# Patient Record
Sex: Male | Born: 1960
Health system: Southern US, Community
[De-identification: ages and names within clinical notes are randomized; demographics above are authoritative.]

## PROBLEM LIST (undated history)

## (undated) DIAGNOSIS — K635 Polyp of colon: Secondary | ICD-10-CM

## (undated) DIAGNOSIS — K5792 Diverticulitis of intestine, part unspecified, without perforation or abscess without bleeding: Secondary | ICD-10-CM

## (undated) DIAGNOSIS — Z86010 Personal history of colonic polyps: Secondary | ICD-10-CM

## (undated) DIAGNOSIS — K219 Gastro-esophageal reflux disease without esophagitis: Secondary | ICD-10-CM

## (undated) DIAGNOSIS — K760 Fatty (change of) liver, not elsewhere classified: Secondary | ICD-10-CM

## (undated) DIAGNOSIS — I10 Essential (primary) hypertension: Secondary | ICD-10-CM

## (undated) HISTORY — DX: Fatty (change of) liver, not elsewhere classified: K76.0

## (undated) HISTORY — DX: Gastro-esophageal reflux disease without esophagitis: K21.9

## (undated) HISTORY — DX: Polyp of colon: K63.5

## (undated) HISTORY — DX: Diverticulitis of intestine, part unspecified, without perforation or abscess without bleeding: K57.92

## (undated) HISTORY — DX: Personal history of colonic polyps: Z86.010

---
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
## 2004-02-14 ENCOUNTER — Emergency Department (HOSPITAL_COMMUNITY): Admission: EM | Admit: 2004-02-14 | Discharge: 2004-02-14 | Payer: Self-pay | Admitting: Emergency Medicine

## 2013-01-19 ENCOUNTER — Encounter (HOSPITAL_COMMUNITY): Payer: Self-pay | Admitting: *Deleted

## 2013-01-19 ENCOUNTER — Emergency Department (HOSPITAL_COMMUNITY)
Admission: EM | Admit: 2013-01-19 | Discharge: 2013-01-19 | Disposition: A | Discharge status: Home / Self Care | Payer: BC Managed Care – PPO | Attending: Emergency Medicine | Admitting: Emergency Medicine

## 2013-01-19 DIAGNOSIS — T1590XA Foreign body on external eye, part unspecified, unspecified eye, initial encounter: Secondary | ICD-10-CM | POA: Insufficient documentation

## 2013-01-19 DIAGNOSIS — Y929 Unspecified place or not applicable: Secondary | ICD-10-CM | POA: Insufficient documentation

## 2013-01-19 DIAGNOSIS — Y9389 Activity, other specified: Secondary | ICD-10-CM | POA: Insufficient documentation

## 2013-01-19 DIAGNOSIS — T1592XA Foreign body on external eye, part unspecified, left eye, initial encounter: Secondary | ICD-10-CM

## 2013-01-19 DIAGNOSIS — Z79899 Other long term (current) drug therapy: Secondary | ICD-10-CM | POA: Insufficient documentation

## 2013-01-19 DIAGNOSIS — I10 Essential (primary) hypertension: Secondary | ICD-10-CM | POA: Insufficient documentation

## 2013-01-19 DIAGNOSIS — Z7982 Long term (current) use of aspirin: Secondary | ICD-10-CM | POA: Insufficient documentation

## 2013-01-19 HISTORY — DX: Essential (primary) hypertension: I10

## 2013-01-19 MED ORDER — TETRACAINE HCL 0.5 % OP SOLN
1.0000 [drp] | Freq: Once | OPHTHALMIC | Status: AC
  Administered 2013-01-19: 1 [drp] via OPHTHALMIC
  Filled 2013-01-19: qty 2

## 2013-01-19 MED ORDER — ERYTHROMYCIN 5 MG/GM OP OINT: TOPICAL_OINTMENT | OPHTHALMIC | Status: DC

## 2013-01-19 MED ORDER — FLUORESCEIN SODIUM 1 MG OP STRP
1.0000 | ORAL_STRIP | Freq: Once | OPHTHALMIC | Status: AC
  Administered 2013-01-19: 1 via OPHTHALMIC
  Filled 2013-01-19: qty 1

## 2013-01-19 NOTE — ED Notes (Signed)
Pt states may have scratched his right eye yesterday; or have something in it; this morning woke up with swelling/draining; painful

## 2013-01-19 NOTE — Progress Notes (Signed)
Pt confirms pcp is [facility] updated

## 2013-01-19 NOTE — ED Notes (Signed)
Pt alert and oriented x4. Respirations even and unlabored. Bilateral rise and fall of chest. Skin warm and dry. In no acute distress. Denies needs.  

## 2013-01-19 NOTE — ED Notes (Signed)
Pt escorted to discharge window. Verbalized understanding discharge instructions. In no acute distress.   

## 2013-01-19 NOTE — ED Provider Notes (Signed)
History     CSN: [id number] Arrival date & time 01/19/13  4098 First MD Initiated Contact with Patient 01/19/13 [phone]      Chief Complaint  Patient presents with  . Eye Pain    HPI Pt was chiseling thin set yesterday when he felt like something got in his eye around 1pm, left eye.  He tried flushing it out but the symptoms have persisted.  He feels like there is debris in the medial canthus aspect. He denies any blurred vision. He does not wear any contacts. He has not had any fevers or change in his vision.   Past Medical History  Diagnosis Date  . Hypertension     History reviewed. No pertinent past surgical history.  No family history on file.  History  Substance Use Topics  . Smoking status: Never Smoker   . Smokeless tobacco: Not on file  . Alcohol Use: No      Review of Systems  All other systems reviewed and are negative.    Allergies  Review of patient's allergies indicates no known allergies.  Home Medications   Current Outpatient Rx  Name  Route  Sig  Dispense  Refill  . aspirin EC 81 MG tablet   Oral   Take 81 mg by mouth daily.         . Coenzyme Q10 (CO Q 10 PO)   Oral   Take 1 tablet by mouth daily.         . Multiple Vitamin (MULTIVITAMIN WITH MINERALS) TABS   Oral   Take 1 tablet by mouth daily.         . Omega-3 Fatty Acids (FISH OIL BURP-LESS PO)   Oral   Take 1 capsule by mouth 2 (two) times daily.         . RABEprazole (ACIPHEX) 20 MG tablet   Oral   Take 20 mg by mouth daily.         Marland Kitchen erythromycin ophthalmic ointment      Place a 1/2 inch ribbon of ointment into the lower eyelid.   1 g   0     BP 153/79  Pulse 67  Temp(Src) 97.6 F (36.4 C) (Oral)  Resp 18  Wt 268 lb (121.564 kg)  SpO2 99%  Physical Exam  Nursing note and vitals reviewed. Constitutional: He appears well-developed and well-nourished. No distress.  HENT:  Head: Normocephalic and atraumatic.  Right Ear: External ear normal.  Left Ear:  External ear normal.  Eyes: Conjunctivae are normal. Pupils are equal, round, and reactive to light. Right eye exhibits no chemosis, no discharge and no exudate. Left eye exhibits no chemosis, no discharge and no exudate. Right conjunctiva is not injected. Left conjunctiva is not injected. No scleral icterus.  Slit lamp exam:      The left eye shows foreign body (small amount of particulate debris inferiorly). The left eye shows no corneal abrasion, no corneal flare and no corneal ulcer.  Neck: Neck supple. No tracheal deviation present.  Cardiovascular: Normal rate.   Pulmonary/Chest: Effort normal. No stridor. No respiratory distress.  Musculoskeletal: He exhibits no edema.  Neurological: He is alert. Cranial nerve deficit: no gross deficits.  Skin: Skin is warm and dry. No rash noted.  Psychiatric: He has a normal mood and affect.    ED Course  Procedures (including critical care time)  Labs Reviewed - No data to display No results found.   1. Eye foreign body, left, initial encounter  MDM  The patient is eyelids were everted and examined. Patient did not have any large foreign body. He did appear to have some white debris within the conjunctival folds. This may have been the thin set material that he was referring.  The patient's eye was irrigated with 500 cc of normal saline. I will refer him to the ophthalmologist for follow up if the symptoms persist.  Patient prescribed antibiotic eye ointment for comfort. Discussed followup plans and reasons to return to emergency room        Celene Kras, MD 01/19/13 [phone]

## 2013-01-19 NOTE — ED Notes (Signed)
Medication and Slit lamp at bedside.  MD notified.

## 2013-05-23 DIAGNOSIS — K5792 Diverticulitis of intestine, part unspecified, without perforation or abscess without bleeding: Secondary | ICD-10-CM

## 2013-05-23 HISTORY — DX: Diverticulitis of intestine, part unspecified, without perforation or abscess without bleeding: K57.92

## 2013-06-21 ENCOUNTER — Emergency Department (HOSPITAL_COMMUNITY): Payer: BC Managed Care – PPO

## 2013-06-21 ENCOUNTER — Emergency Department (HOSPITAL_COMMUNITY)
Admission: EM | Admit: 2013-06-21 | Discharge: 2013-06-22 | Disposition: A | Discharge status: Home / Self Care | Payer: BC Managed Care – PPO | Attending: Emergency Medicine | Admitting: Emergency Medicine

## 2013-06-21 ENCOUNTER — Encounter (HOSPITAL_COMMUNITY): Payer: Self-pay | Admitting: Emergency Medicine

## 2013-06-21 DIAGNOSIS — Z7982 Long term (current) use of aspirin: Secondary | ICD-10-CM | POA: Insufficient documentation

## 2013-06-21 DIAGNOSIS — K5792 Diverticulitis of intestine, part unspecified, without perforation or abscess without bleeding: Secondary | ICD-10-CM

## 2013-06-21 DIAGNOSIS — K760 Fatty (change of) liver, not elsewhere classified: Secondary | ICD-10-CM

## 2013-06-21 DIAGNOSIS — K5732 Diverticulitis of large intestine without perforation or abscess without bleeding: Secondary | ICD-10-CM | POA: Insufficient documentation

## 2013-06-21 DIAGNOSIS — Z79899 Other long term (current) drug therapy: Secondary | ICD-10-CM | POA: Insufficient documentation

## 2013-06-21 DIAGNOSIS — I10 Essential (primary) hypertension: Secondary | ICD-10-CM | POA: Insufficient documentation

## 2013-06-21 DIAGNOSIS — R7402 Elevation of levels of lactic acid dehydrogenase (LDH): Secondary | ICD-10-CM | POA: Insufficient documentation

## 2013-06-21 DIAGNOSIS — K7689 Other specified diseases of liver: Secondary | ICD-10-CM | POA: Insufficient documentation

## 2013-06-21 DIAGNOSIS — R7401 Elevation of levels of liver transaminase levels: Secondary | ICD-10-CM | POA: Insufficient documentation

## 2013-06-21 LAB — LIPASE, BLOOD: Lipase: 24 U/L (ref 11–59)

## 2013-06-21 LAB — CBC WITH DIFFERENTIAL/PLATELET
Basophils Absolute: 0 10*3/uL (ref 0.0–0.1)
Basophils Relative: 0 % (ref 0–1)
Eosinophils Absolute: 0.1 10*3/uL (ref 0.0–0.7)
Eosinophils Relative: 1 % (ref 0–5)
HCT: 46.2 % (ref 39.0–52.0)
Hemoglobin: 16.7 g/dL (ref 13.0–17.0)
Lymphocytes Relative: 27 % (ref 12–46)
Lymphs Abs: 3.4 10*3/uL (ref 0.7–4.0)
MCH: 32.3 pg (ref 26.0–34.0)
MCHC: 36.1 g/dL — ABNORMAL HIGH (ref 30.0–36.0)
MCV: 89.4 fL (ref 78.0–100.0)
Monocytes Absolute: 1.4 10*3/uL — ABNORMAL HIGH (ref 0.1–1.0)
Monocytes Relative: 11 % (ref 3–12)
Neutro Abs: 7.8 10*3/uL — ABNORMAL HIGH (ref 1.7–7.7)
Neutrophils Relative %: 61 % (ref 43–77)
Platelets: 293 10*3/uL (ref 150–400)
RBC: 5.17 MIL/uL (ref 4.22–5.81)
RDW: 12.5 % (ref 11.5–15.5)
WBC: 12.7 10*3/uL — ABNORMAL HIGH (ref 4.0–10.5)

## 2013-06-21 LAB — URINALYSIS, ROUTINE W REFLEX MICROSCOPIC
Bilirubin Urine: NEGATIVE
Glucose, UA: NEGATIVE mg/dL
Hgb urine dipstick: NEGATIVE
Ketones, ur: NEGATIVE mg/dL
Leukocytes, UA: NEGATIVE
Nitrite: NEGATIVE
Protein, ur: NEGATIVE mg/dL
Specific Gravity, Urine: 1.028 (ref 1.005–1.030)
Urobilinogen, UA: 0.2 mg/dL (ref 0.0–1.0)
pH: 6 (ref 5.0–8.0)

## 2013-06-21 LAB — COMPREHENSIVE METABOLIC PANEL
ALT: 73 U/L — ABNORMAL HIGH (ref 0–53)
AST: 56 U/L — ABNORMAL HIGH (ref 0–37)
Albumin: 4.4 g/dL (ref 3.5–5.2)
Alkaline Phosphatase: 78 U/L (ref 39–117)
BUN: 17 mg/dL (ref 6–23)
CO2: 27 mEq/L (ref 19–32)
Calcium: 10.1 mg/dL (ref 8.4–10.5)
Chloride: 98 mEq/L (ref 96–112)
Creatinine, Ser: 1.08 mg/dL (ref 0.50–1.35)
GFR calc Af Amer: 89 mL/min — ABNORMAL LOW (ref >90)
GFR calc non Af Amer: 77 mL/min — ABNORMAL LOW (ref >90)
Glucose, Bld: 98 mg/dL (ref 70–99)
Potassium: 4.8 mEq/L (ref 3.5–5.1)
Sodium: 137 mEq/L (ref 135–145)
Total Bilirubin: 0.8 mg/dL (ref 0.3–1.2)
Total Protein: 8.1 g/dL (ref 6.0–8.3)

## 2013-06-21 MED ORDER — FENTANYL CITRATE 0.05 MG/ML IJ SOLN
100.0000 ug | Freq: Once | INTRAMUSCULAR | Status: AC
  Administered 2013-06-21: 100 ug via INTRAVENOUS

## 2013-06-21 MED ORDER — IOHEXOL 300 MG/ML  SOLN
50.0000 mL | Freq: Once | INTRAMUSCULAR | Status: AC | PRN
  Administered 2013-06-21: 50 mL via ORAL

## 2013-06-21 MED ORDER — ONDANSETRON HCL 4 MG/2ML IJ SOLN
4.0000 mg | Freq: Once | INTRAMUSCULAR | Status: AC
  Administered 2013-06-21: 4 mg via INTRAVENOUS

## 2013-06-21 MED ORDER — ONDANSETRON HCL 4 MG/2ML IJ SOLN
INTRAMUSCULAR | Status: AC
  Administered 2013-06-21: 4 mg via INTRAVENOUS
  Filled 2013-06-21: qty 2

## 2013-06-21 MED ORDER — SODIUM CHLORIDE 0.9 % IV SOLN
Freq: Once | INTRAVENOUS | Status: AC
  Administered 2013-06-21: 23:00:00 via INTRAVENOUS

## 2013-06-21 MED ORDER — FENTANYL CITRATE 0.05 MG/ML IJ SOLN
INTRAMUSCULAR | Status: AC
  Administered 2013-06-21: 100 ug via INTRAVENOUS
  Filled 2013-06-21: qty 2

## 2013-06-21 NOTE — ED Notes (Signed)
Pt states for the past week or so he has been having pain in his lower back on the left side  Pt states today when he got home from work he was rubbing his back and when he hit his flank area he states he had a lot of pain there  Pt states when he takes a deep breath it hurts in that area  Pt states he has nausea

## 2013-06-21 NOTE — ED Provider Notes (Signed)
CSN: [id number]     Arrival date & time 06/21/13  2014 History   First MD Initiated Contact with Patient 06/21/13 2159     Chief Complaint  Patient presents with  . Back Pain   (Consider location/radiation/quality/duration/timing/severity/associated sxs/prior Treatment) HPI  Andrew Reyes is a 52 y/o male who presents with cc back pain. The patient has a history of upper back pain. He states that he sent to get on his heating pad last night and was rubbing his lower back when he noticed a sharp severe pain in his flank and left lower quadrant of the abdomen.  Patient states that the pain has been constant for that time.  He states that is better when he lies completely still.  Worse when standing or walking.  He has pain when he breathes deeply or coughs.  Patient denies any nausea, vomiting, diarrhea or other changes in his stool.  Patient denies history of kidney stones, urinary symptoms.  Denies weakness, loss of bowel/bladder function or saddle anesthesia. Denies neck stiffness, headache, rash.  Denies fever or recent procedures to back.    Past Medical History  Diagnosis Date  . Hypertension    History reviewed. No pertinent past surgical history. Family History  Problem Relation Age of Onset  . Hypertension Mother   . Diabetes Mother   . Stroke Mother   . Hypertension Father   . Diabetes Father    History  Substance Use Topics  . Smoking status: Never Smoker   . Smokeless tobacco: Not on file  . Alcohol Use: Yes     Comment: weekend    Review of Systems Ten systems reviewed and are negative for acute change, except as noted in the HPI. \ Allergies  Review of patient's allergies indicates no known allergies.  Home Medications   Current Outpatient Rx  Name  Route  Sig  Dispense  Refill  . aspirin EC 81 MG tablet   Oral   Take 81 mg by mouth daily.         . Coenzyme Q10 (CO Q 10 PO)   Oral   Take 1 tablet by mouth daily.         Marland Kitchen  lisinopril-hydrochlorothiazide (PRINZIDE,ZESTORETIC) 20-12.5 MG per tablet   Oral   Take 1 tablet by mouth daily.          . Multiple Vitamin (MULTIVITAMIN WITH MINERALS) TABS   Oral   Take 1 tablet by mouth daily.         . nabumetone (RELAFEN) 500 MG tablet   Oral   Take 500 mg by mouth 2 (two) times daily as needed for pain.          . Omega-3 Fatty Acids (FISH OIL BURP-LESS PO)   Oral   Take 1 capsule by mouth 2 (two) times daily.         . RABEprazole (ACIPHEX) 20 MG tablet   Oral   Take 20 mg by mouth daily.         Marland Kitchen tiZANidine (ZANAFLEX) 4 MG tablet   Oral   Take 4 mg by mouth every 6 (six) hours as needed (muscle spasms).           BP 126/71  Pulse 70  Temp(Src) 98.5 F (36.9 C) (Oral)  Resp 22  SpO2 96% Physical Exam  Nursing note and vitals reviewed. Constitutional: He appears well-developed and well-nourished.  Patient appears very uncomfortable.  HENT:  Head: Normocephalic and atraumatic.  Eyes:  Conjunctivae are normal. No scleral icterus.  Neck: Normal range of motion. Neck supple.  Cardiovascular: Normal rate, regular rhythm and normal heart sounds.   Pulmonary/Chest: Effort normal and breath sounds normal. No respiratory distress.  Abdominal: Soft. He exhibits no distension. There is tenderness (Exquisitely tender to palpation of the left lower quadrant of the abdomen). There is no rebound and no guarding.  Musculoskeletal: He exhibits no edema.  Neurological: He is alert.  Skin: Skin is warm and dry.  Psychiatric: His behavior is normal.   .  ED Course  Procedures (including critical care time) Labs Review Labs Reviewed  CBC WITH DIFFERENTIAL - Abnormal; Notable for the following:    WBC 12.7 (*)    MCHC 36.1 (*)    Neutro Abs 7.8 (*)    Monocytes Absolute 1.4 (*)    All other components within normal limits  COMPREHENSIVE METABOLIC PANEL - Abnormal; Notable for the following:    AST 56 (*)    ALT 73 (*)    GFR calc non Af  Amer 77 (*)    GFR calc Af Amer 89 (*)    All other components within normal limits  URINALYSIS, ROUTINE W REFLEX MICROSCOPIC  LIPASE, BLOOD   Imaging Review No results found.  MDM  No diagnosis found. 12:14 AM BP 126/71  Pulse 70  Temp(Src) 98.5 F (36.9 C) (Oral)  Resp 22  SpO2 95% Patient with abdominal and flank pain. He is guarding with exquisite TTP of the LLQ. ? Possible colitis, psoas abscess or psoas syndrome. UA is clear and I do not suspect kidney stone. Transaminitis, CT pending Patient with abdominal and flank pain.  BP 115/62  Pulse 70  Temp(Src) 97.7 F (36.5 C) (Oral)  Resp 20  SpO2 96% Patient Ct shows fatty liver and acute uncomplicated diverticulitis. vss. Patient will be discharged home with cipro and flagyll. Liquid diet and then advance diet slowly. Follow up with Pcp, Return precautions discussed.    Arthor Captain, PA-C 06/25/13 1536

## 2013-06-21 NOTE — ED Notes (Signed)
Patient medicated, see MAR Patient and pt's wife deny further needs or concerns at this time Side rails up, call bell in reach  Patient finished with PO contrast

## 2013-06-22 ENCOUNTER — Encounter (HOSPITAL_COMMUNITY): Payer: Self-pay

## 2013-06-22 MED ORDER — HYDROCODONE-ACETAMINOPHEN 5-325 MG PO TABS: 1.0000 | ORAL_TABLET | Freq: Four times a day (QID) | ORAL | Status: DC | PRN

## 2013-06-22 MED ORDER — ONDANSETRON HCL 4 MG PO TABS: 4.0000 mg | ORAL_TABLET | Freq: Four times a day (QID) | ORAL | Status: DC

## 2013-06-22 MED ORDER — METRONIDAZOLE 500 MG PO TABS: 500.0000 mg | ORAL_TABLET | Freq: Three times a day (TID) | ORAL | Status: DC

## 2013-06-22 MED ORDER — IOHEXOL 300 MG/ML  SOLN
100.0000 mL | Freq: Once | INTRAMUSCULAR | Status: AC | PRN
  Administered 2013-06-22: 100 mL via INTRAVENOUS

## 2013-06-22 MED ORDER — CIPROFLOXACIN HCL 500 MG PO TABS: 500.0000 mg | ORAL_TABLET | Freq: Two times a day (BID) | ORAL | Status: DC

## 2013-06-22 NOTE — ED Notes (Signed)
While DC patient, patient stated that pain was starting to return This nurse offered to have the attending provider notified and order for additional pain medication to be placed Patient declined this offer several times Patient's wife was present during this conversation and encouraged patient to notify attending for additional pain medication, but again patient declined

## 2013-06-22 NOTE — ED Notes (Signed)
Discharge instructions reviewed with patient and patient's wife Rx reviewed with patient and patient's wife Follow up instructions reviewed with patient and patient's wife Patient and patient's wife verbalized understanding to DC instructions, Rx and follow up care All questions answered  Patient and patient's wife deny further needs or concerns at this time Patient alert and oriented x 4 upon time of DC and in NAD Patient's wife wanted patient to be DC in wheelchair. Wheelchair brought to bedside and patient declined wheelchair, stated that he would prefer to walk. Offered wheelchair several times, patient declined. Patient able to ambulate with steady gait and without assistance from nursing staff or wife.

## 2013-06-25 NOTE — ED Provider Notes (Signed)
Medical screening examination/treatment/procedure(s) were performed by non-physician practitioner and as supervising physician I was immediately available for consultation/collaboration.  Mitzie Marlar F Florrie Ramires, MD 06/25/13 1857 

## 2013-07-15 ENCOUNTER — Encounter: Payer: Self-pay | Admitting: Internal Medicine

## 2013-09-22 HISTORY — PX: COLONOSCOPY: SHX174

## 2013-09-30 ENCOUNTER — Encounter: Payer: BC Managed Care – PPO | Admitting: Internal Medicine

## 2014-06-28 ENCOUNTER — Ambulatory Visit (AMBULATORY_SURGERY_CENTER): Payer: Self-pay | Admitting: *Deleted

## 2014-06-28 VITALS — Ht 72.0 in | Wt 265.8 lb

## 2014-06-28 DIAGNOSIS — Z1211 Encounter for screening for malignant neoplasm of colon: Secondary | ICD-10-CM

## 2014-06-28 NOTE — Progress Notes (Signed)
No allergies to eggs or soy. No problems with anesthesia.  Pt given Emmi instructions for colonoscopy  No oxygen use  No diet drug use  

## 2014-07-04 ENCOUNTER — Encounter: Payer: Self-pay | Admitting: Internal Medicine

## 2014-07-10 ENCOUNTER — Encounter: Payer: Self-pay | Admitting: Internal Medicine

## 2014-07-10 ENCOUNTER — Ambulatory Visit (AMBULATORY_SURGERY_CENTER): Payer: BC Managed Care – PPO | Admitting: Internal Medicine

## 2014-07-10 VITALS — BP 107/71 | HR 54 | Temp 97.1°F | Resp 46 | Ht 72.0 in | Wt 265.0 lb

## 2014-07-10 DIAGNOSIS — D125 Benign neoplasm of sigmoid colon: Secondary | ICD-10-CM

## 2014-07-10 DIAGNOSIS — Z1211 Encounter for screening for malignant neoplasm of colon: Secondary | ICD-10-CM

## 2014-07-10 DIAGNOSIS — K5730 Diverticulosis of large intestine without perforation or abscess without bleeding: Secondary | ICD-10-CM

## 2014-07-10 DIAGNOSIS — K635 Polyp of colon: Secondary | ICD-10-CM

## 2014-07-10 MED ORDER — SODIUM CHLORIDE 0.9 % IV SOLN: 500.0000 mL | INTRAVENOUS | Status: DC

## 2014-07-10 NOTE — Patient Instructions (Addendum)
I found and removed one tiny polyp. You also have a condition called diverticulosis - common and not usually a problem. Please read the handout provided.  I will let you know pathology results and when to have another routine colonoscopy by mail.  I appreciate the opportunity to care for you. Gatha Mayer, MD, FACG   YOU HAD AN ENDOSCOPIC PROCEDURE TODAY AT [facility]: Refer to the procedure report that was given to you for any specific questions about what was found during the examination.  If the procedure report does not answer your questions, please call your gastroenterologist to clarify.  If you requested that your care partner not be given the details of your procedure findings, then the procedure report has been included in a sealed envelope for you to review at your convenience later.  YOU SHOULD EXPECT: Some feelings of bloating in the abdomen. Passage of more gas than usual.  Walking can help get rid of the air that was put into your GI tract during the procedure and reduce the bloating. If you had a lower endoscopy (such as a colonoscopy or flexible sigmoidoscopy) you may notice spotting of blood in your stool or on the toilet paper. If you underwent a bowel prep for your procedure, then you may not have a normal bowel movement for a few days.  DIET: Your first meal following the procedure should be a light meal and then it is ok to progress to your normal diet.  A half-sandwich or bowl of soup is an example of a good first meal.  Heavy or fried foods are harder to digest and may make you feel nauseous or bloated.  Likewise meals heavy in dairy and vegetables can cause extra gas to form and this can also increase the bloating.  Drink plenty of fluids but you should avoid alcoholic beverages for 24 hours.  ACTIVITY: Your care partner should take you home directly after the procedure.  You should plan to take it easy, moving slowly for the rest of the day.  You  can resume normal activity the day after the procedure however you should NOT DRIVE or use heavy machinery for 24 hours (because of the sedation medicines used during the test).    SYMPTOMS TO REPORT IMMEDIATELY: A gastroenterologist can be reached at any hour.  During normal business hours, 8:30 AM to 5:00 PM Monday through Friday, call [phone].  After hours and on weekends, please call the GI answering service at [phone] who will take a message and have the physician on call contact you.   Following lower endoscopy (colonoscopy or flexible sigmoidoscopy):  Excessive amounts of blood in the stool  Significant tenderness or worsening of abdominal pains  Swelling of the abdomen that is new, acute  Fever of 100F or higher  FOLLOW UP: If any biopsies were taken you will be contacted by phone or by letter within the next 1-3 weeks.  Call your gastroenterologist if you have not heard about the biopsies in 3 weeks.  Our staff will call the home number listed on your records the next business day following your procedure to check on you and address any questions or concerns that you may have at that time regarding the information given to you following your procedure. This is a courtesy call and so if there is no answer at the home number and we have not heard from you through the emergency physician on call, we will assume that  you have returned to your regular daily activities without incident.  SIGNATURES/CONFIDENTIALITY: You and/or your care partner have signed paperwork which will be entered into your electronic medical record.  These signatures attest to the fact that that the information above on your After Visit Summary has been reviewed and is understood.  Full responsibility of the confidentiality of this discharge information lies with you and/or your care-partner.

## 2014-07-10 NOTE — Progress Notes (Signed)
A/ox3 pleased with MAC, report to Karen RN 

## 2014-07-10 NOTE — Progress Notes (Signed)
Called to room to assist during endoscopic procedure.  Patient ID and intended procedure confirmed with present staff. Received instructions for my participation in the procedure from the performing physician.  

## 2014-07-10 NOTE — Op Note (Signed)
[facility]  [address] [place], [address]   COLONOSCOPY PROCEDURE REPORT  PATIENT: Andrew Reyes, Andrew Reyes  MR#: [id number] BIRTHDATE: 31-Jan-1961 , 36  yrs. old GENDER: male ENDOSCOPIST: Gatha Mayer, MD, [facility] PROCEDURE DATE:  07/10/2014 PROCEDURE:   Colonoscopy with snare polypectomy First Screening Colonoscopy - Avg.  risk and is 50 yrs.  old or older Yes.  Prior Negative Screening - Now for repeat screening. N/A  History of Adenoma - Now for follow-up colonoscopy & has been > or = to 3 yrs.  N/A  Polyps Removed Today? Yes. ASA CLASS:   Class II INDICATIONS:average risk for colorectal cancer and first colonoscopy. MEDICATIONS: Propofol 370 mg IV and Monitored anesthesia care  DESCRIPTION OF PROCEDURE:   After the risks benefits and alternatives of the procedure were thoroughly explained, informed consent was obtained.  The digital rectal exam revealed no abnormalities of the rectum, revealed no prostatic nodules, and revealed the prostate was not enlarged.   The LB [id number] endoscope was introduced through the anus and advanced to the cecum, which was identified by both the appendix and ileocecal valve. No adverse events experienced.   The quality of the prep was excellent, using MiraLax  The instrument was then slowly withdrawn as the colon was fully examined.      COLON FINDINGS: A sessile polyp measuring 4 mm in size was found in the sigmoid colon.  A polypectomy was performed with a cold snare. The resection was complete, the polyp tissue was completely retrieved and sent to histology.   There was moderate diverticulosis noted in the left colon.   The examination was otherwise normal.  Retroflexed views revealed no abnormalities. The time to cecum=2 minutes 56 seconds.  Withdrawal time=11 minutes 41 seconds.  The scope was withdrawn and the procedure completed. COMPLICATIONS: There were no immediate complications.  ENDOSCOPIC IMPRESSION: 1.    Sessile polyp measuring 4 mm in size was found in the sigmoid colon; polypectomy was performed with a cold snare 2.   Moderate diverticulosis was noted in the left colon 3.   The examination was otherwise normal - excellent prep  RECOMMENDATIONS: Timing of repeat colonoscopy will be determined by pathology findings.  eSigned:  Gatha Mayer, MD, [facility] 07/10/2014 11:40 AM   cc: The Patient and [name] Helper PA-C ([place], [place])

## 2014-07-11 ENCOUNTER — Telehealth: Payer: Self-pay

## 2014-07-11 NOTE — Telephone Encounter (Signed)
  Follow up Call-  Call back number 07/10/2014  Post procedure Call Back phone  # [phone]  Permission to leave phone message Yes     Patient questions:  Do you have a fever, pain , or abdominal swelling? No. Pain Score  0 *  Have you tolerated food without any problems? Yes.    Have you been able to return to your normal activities? Yes.    Do you have any questions about your discharge instructions: Diet   No. Medications  No. Follow up visit  No.  Do you have questions or concerns about your Care? No.  Actions: * If pain score is 4 or above: No action needed, pain <4.

## 2014-07-12 ENCOUNTER — Encounter: Payer: BC Managed Care – PPO | Admitting: Internal Medicine

## 2014-07-17 ENCOUNTER — Encounter: Payer: Self-pay | Admitting: Internal Medicine

## 2014-07-17 DIAGNOSIS — Z86010 Personal history of colonic polyps: Secondary | ICD-10-CM

## 2014-07-17 HISTORY — DX: Personal history of colonic polyps: Z86.010

## 2014-07-17 NOTE — Progress Notes (Signed)
Quick Note:  4 mm ssp Repeat colon 2020 ______

## 2016-10-13 ENCOUNTER — Telehealth: Payer: Self-pay

## 2016-10-13 NOTE — Telephone Encounter (Signed)
NOTES SENT TO SCHEDULING.  °

## 2016-10-31 ENCOUNTER — Encounter (INDEPENDENT_AMBULATORY_CARE_PROVIDER_SITE_OTHER): Payer: Self-pay

## 2016-10-31 ENCOUNTER — Encounter: Payer: Self-pay | Admitting: Cardiology

## 2016-10-31 ENCOUNTER — Ambulatory Visit (INDEPENDENT_AMBULATORY_CARE_PROVIDER_SITE_OTHER): Payer: BLUE CROSS/BLUE SHIELD | Admitting: Cardiology

## 2016-10-31 VITALS — BP 132/84 | HR 61 | Ht 73.0 in | Wt 276.0 lb

## 2016-10-31 DIAGNOSIS — R002 Palpitations: Secondary | ICD-10-CM | POA: Insufficient documentation

## 2016-10-31 DIAGNOSIS — R0602 Shortness of breath: Secondary | ICD-10-CM | POA: Diagnosis not present

## 2016-10-31 DIAGNOSIS — E6609 Other obesity due to excess calories: Secondary | ICD-10-CM | POA: Diagnosis not present

## 2016-10-31 DIAGNOSIS — I10 Essential (primary) hypertension: Secondary | ICD-10-CM

## 2016-10-31 NOTE — Progress Notes (Signed)
Cardiology Office Note    Date:  10/31/2016   ID:  Andrew Reyes, DOB Jul 15, 1961, MRN [id number]  PCP:  Beatris Si  Cardiologist:   Candee Furbish, MD     History of Present Illness:  Andrew Reyes is a 56 y.o. male bHere for evaluation of arrhythmia, tachycardia episodes at the request of Corine Shelter, [place]. Has risk factors of obesity, hypertension, newly diagnosed diabetes.  He had 4 episodes over the last 6 months of irregular heartbeat. He feels approximate 4-5 beats rapid succession with abrupt onset and off. At one time, he felt a little dizziness with this and felt hot and flushed. They do not seem to be associated with excessive caffeine. He was recently diagnosed with diabetes and he is trying his best to lose weight. He does workout one episode where he had not eaten all day.  He has significant reduced his alcohol intake since his blood glucose was elevated. Denies any other stimulants, supplements.  Denies syncope, chest pain. He does have mild shortness of breath which he thinks may be from deconditioning. At one point he used to be able to bench press 400 pounds.  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    Past Medical History:  Diagnosis Date  . Diverticulitis 05/2013  . GERD (gastroesophageal reflux disease)   . History of colonic polyp - sessile serrated polyp 07/17/2014  . Hypertension     Past Surgical History:  Procedure Laterality Date  . TONSILLECTOMY  1976    Current Medications: Outpatient Medications Prior to Visit  Medication Sig Dispense Refill  . aspirin EC 81 MG tablet Take 81 mg by mouth daily.    . Coenzyme Q10 (CO Q 10 PO) Take 1 tablet by mouth daily.    Marland Kitchen lisinopril-hydrochlorothiazide (PRINZIDE,ZESTORETIC) 20-12.5 MG per tablet Take 1 tablet by mouth  daily.     . Multiple Vitamin (MULTIVITAMIN WITH MINERALS) TABS Take 1 tablet by mouth daily.    . Omega-3 Fatty Acids (FISH OIL BURP-LESS PO) Take 1 capsule by mouth 2 (two) times daily.    . RABEprazole (ACIPHEX) 20 MG tablet Take 20 mg by mouth daily.     No facility-administered medications prior to visit.      Allergies:   Patient has no known allergies.   Social History   Social History  . Marital status: Married    Spouse name: N/A  . Number of children: N/A  . Years of education: N/A   Social History Main Topics  . Smoking status: Never Smoker  . Smokeless tobacco: Never Used  . Alcohol use 3.6 oz/week    6 Cans of beer per week     Comment: weekend  . Drug use: No  . Sexual activity: Not Asked   Other Topics Concern  . None   Social History Narrative  . None     Family History:  The patient's family history includes Diabetes in his father and mother; Hypertension in his father and mother; Stroke in his mother.   ROS:   Please see the history of present illness.    ROS All other systems reviewed and are negative.   PHYSICAL EXAM:   VS:  BP 132/84   Pulse 61   Ht 6\' 1"  (1.854 m)  Wt 276 lb (125.2 kg)   BMI 36.41 kg/m    GEN: Well nourished, well developed, in no acute distress  HEENT: normal  Neck: no JVD, carotid bruits, or masses Cardiac: RRR; no murmurs, rubs, or gallops,no edema  Respiratory:  clear to auscultation bilaterally, normal work of breathing GI: soft, nontender, nondistended, + BS, overweight MS: no deformity or atrophy  Skin: warm and dry, no rash Neuro:  Alert and Oriented x 3, Strength and sensation are intact Psych: euthymic mood, full affect  Wt Readings from Last 3 Encounters:  10/31/16 276 lb (125.2 kg)  07/10/14 265 lb (120.2 kg)  06/28/14 265 lb 12.8 oz (120.6 kg)      Studies/Labs Reviewed:   EKG:  EKG is ordered today.  The ekg ordered today demonstrates Sinus rhythm heart rate 61 with no other abnormalities, normal  QT interval of 382 ms.  Recent Labs: No results found for requested labs within last 8760 hours.   Lipid Panel No results found for: CHOL, TRIG, HDL, CHOLHDL, VLDL, LDLCALC, LDLDIRECT  Additional studies/ records that were reviewed today include:  Medical records, EKG, lab work reviewed    ASSESSMENT:    1. Palpitations   2. Shortness of breath   3. Class 1 obesity due to excess calories with serious comorbidity in adult, unspecified BMI   4. Essential hypertension      PLAN:  In order of problems listed above:  Palpitations  - By his description, infrequent quick short burst of 45 beats likely representative of paroxysmal atrial tachycardia.  Mild shortness of breath  - Likely from deconditioning, weight. We will check an echocardiogram to interpret proper structure and function.  Diabetes mellitus without complication  - Working on diet, exercise  - Weight loss would be extremely beneficial for him.  - We discussed this at length.  Essential hypertension  - Continue with the lisinopril hydrochlorothiazide. Excellent.  Obesity  - Continue to encourage weight loss  He will let me know if any further worrisome arrhythmias develop. Like acid, likely paroxysmal atrial tachycardia. The way he describes does not sound like any prolonged episodes of atrial fibrillation although he has many risk factors for this such as obesity. I think the utility of placing a 30 day event monitor would be low at this point given the sporadic nature of his arrhythmia. Also, no medication is warranted at this time. He will let me know if anything changes.  Medication Adjustments/Labs and Tests Ordered: Current medicines are reviewed at length with the patient today.  Concerns regarding medicines are outlined above.  Medication changes, Labs and Tests ordered today are listed in the Patient Instructions below. Patient Instructions  Medication Instructions:  The current medical regimen is  effective;  continue present plan and medications.  Testing/Procedures: Your physician has requested that you have an echocardiogram. Echocardiography is a painless test that uses sound waves to create images of your heart. It provides your doctor with information about the size and shape of your heart and how well your heart's chambers and valves are working. This procedure takes approximately one hour. There are no restrictions for this procedure.  Follow-Up: Further follow up will be based on the results above.  Thank you for choosing [facility]!!        Signed, Candee Furbish, MD  10/31/2016 10:41 AM    [facility] [place], [place],   [address] Phone: [phone]; Fax: [phone]

## 2016-10-31 NOTE — Patient Instructions (Signed)
Medication Instructions:  The current medical regimen is effective;  continue present plan and medications.  Testing/Procedures: Your physician has requested that you have an echocardiogram. Echocardiography is a painless test that uses sound waves to create images of your heart. It provides your doctor with information about the size and shape of your heart and how well your heart's chambers and valves are working. This procedure takes approximately one hour. There are no restrictions for this procedure.  Follow-Up: Further follow up will be based on the results above.  Thank you for choosing [facility]!!

## 2016-11-10 ENCOUNTER — Other Ambulatory Visit (HOSPITAL_COMMUNITY): Payer: BLUE CROSS/BLUE SHIELD

## 2016-12-16 ENCOUNTER — Other Ambulatory Visit: Payer: Self-pay

## 2016-12-16 ENCOUNTER — Ambulatory Visit (HOSPITAL_COMMUNITY): Payer: BLUE CROSS/BLUE SHIELD | Attending: Cardiology

## 2016-12-16 DIAGNOSIS — R002 Palpitations: Secondary | ICD-10-CM | POA: Diagnosis not present

## 2016-12-16 DIAGNOSIS — I10 Essential (primary) hypertension: Secondary | ICD-10-CM | POA: Insufficient documentation

## 2016-12-16 DIAGNOSIS — E119 Type 2 diabetes mellitus without complications: Secondary | ICD-10-CM | POA: Diagnosis not present

## 2016-12-16 DIAGNOSIS — R0602 Shortness of breath: Secondary | ICD-10-CM | POA: Diagnosis not present

## 2017-09-11 DIAGNOSIS — J018 Other acute sinusitis: Secondary | ICD-10-CM | POA: Diagnosis not present

## 2018-01-26 DIAGNOSIS — I10 Essential (primary) hypertension: Secondary | ICD-10-CM | POA: Diagnosis not present

## 2018-01-26 DIAGNOSIS — K219 Gastro-esophageal reflux disease without esophagitis: Secondary | ICD-10-CM | POA: Diagnosis not present

## 2018-01-26 DIAGNOSIS — E119 Type 2 diabetes mellitus without complications: Secondary | ICD-10-CM | POA: Diagnosis not present

## 2018-01-26 DIAGNOSIS — E7800 Pure hypercholesterolemia, unspecified: Secondary | ICD-10-CM | POA: Diagnosis not present

## 2018-06-05 ENCOUNTER — Emergency Department (HOSPITAL_BASED_OUTPATIENT_CLINIC_OR_DEPARTMENT_OTHER): Payer: BLUE CROSS/BLUE SHIELD

## 2018-06-05 ENCOUNTER — Encounter (HOSPITAL_BASED_OUTPATIENT_CLINIC_OR_DEPARTMENT_OTHER): Payer: Self-pay | Admitting: *Deleted

## 2018-06-05 ENCOUNTER — Emergency Department (HOSPITAL_BASED_OUTPATIENT_CLINIC_OR_DEPARTMENT_OTHER)
Admission: EM | Admit: 2018-06-05 | Discharge: 2018-06-05 | Disposition: A | Discharge status: Home / Self Care | Payer: BLUE CROSS/BLUE SHIELD | Attending: Emergency Medicine | Admitting: Emergency Medicine

## 2018-06-05 ENCOUNTER — Other Ambulatory Visit: Payer: Self-pay

## 2018-06-05 DIAGNOSIS — Z79899 Other long term (current) drug therapy: Secondary | ICD-10-CM | POA: Diagnosis not present

## 2018-06-05 DIAGNOSIS — R1030 Lower abdominal pain, unspecified: Secondary | ICD-10-CM | POA: Diagnosis not present

## 2018-06-05 DIAGNOSIS — Z7982 Long term (current) use of aspirin: Secondary | ICD-10-CM | POA: Diagnosis not present

## 2018-06-05 DIAGNOSIS — K5792 Diverticulitis of intestine, part unspecified, without perforation or abscess without bleeding: Secondary | ICD-10-CM | POA: Insufficient documentation

## 2018-06-05 DIAGNOSIS — I10 Essential (primary) hypertension: Secondary | ICD-10-CM | POA: Insufficient documentation

## 2018-06-05 DIAGNOSIS — K5732 Diverticulitis of large intestine without perforation or abscess without bleeding: Secondary | ICD-10-CM | POA: Diagnosis not present

## 2018-06-05 LAB — URINALYSIS, MICROSCOPIC (REFLEX)

## 2018-06-05 LAB — COMPREHENSIVE METABOLIC PANEL
ALT: 60 U/L — ABNORMAL HIGH (ref 0–44)
AST: 36 U/L (ref 15–41)
Albumin: 4.2 g/dL (ref 3.5–5.0)
Alkaline Phosphatase: 65 U/L (ref 38–126)
Anion gap: 11 (ref 5–15)
BUN: 15 mg/dL (ref 6–20)
CO2: 25 mmol/L (ref 22–32)
Calcium: 9.3 mg/dL (ref 8.9–10.3)
Chloride: 105 mmol/L (ref 98–111)
Creatinine, Ser: 0.92 mg/dL (ref 0.61–1.24)
GFR calc Af Amer: >60 mL/min (ref >60)
GFR calc non Af Amer: >60 mL/min (ref >60)
Glucose, Bld: 123 mg/dL — ABNORMAL HIGH (ref 70–99)
Potassium: 4.4 mmol/L (ref 3.5–5.1)
Sodium: 141 mmol/L (ref 135–145)
Total Bilirubin: 1 mg/dL (ref 0.3–1.2)
Total Protein: 7.4 g/dL (ref 6.5–8.1)

## 2018-06-05 LAB — URINALYSIS, ROUTINE W REFLEX MICROSCOPIC
Bilirubin Urine: NEGATIVE
Glucose, UA: NEGATIVE mg/dL
Ketones, ur: NEGATIVE mg/dL
Leukocytes, UA: NEGATIVE
Nitrite: NEGATIVE
Protein, ur: NEGATIVE mg/dL
Specific Gravity, Urine: 1.02 (ref 1.005–1.030)
pH: 6.5 (ref 5.0–8.0)

## 2018-06-05 LAB — CBC
HCT: 44.2 % (ref 39.0–52.0)
Hemoglobin: 15.3 g/dL (ref 13.0–17.0)
MCH: 31.4 pg (ref 26.0–34.0)
MCHC: 34.6 g/dL (ref 30.0–36.0)
MCV: 90.8 fL (ref 78.0–100.0)
Platelets: 264 10*3/uL (ref 150–400)
RBC: 4.87 MIL/uL (ref 4.22–5.81)
RDW: 12.6 % (ref 11.5–15.5)
WBC: 12.1 10*3/uL — ABNORMAL HIGH (ref 4.0–10.5)

## 2018-06-05 LAB — LIPASE, BLOOD: Lipase: 27 U/L (ref 11–51)

## 2018-06-05 MED ORDER — MORPHINE SULFATE (PF) 4 MG/ML IV SOLN
4.0000 mg | Freq: Once | INTRAVENOUS | Status: AC
  Administered 2018-06-05: 4 mg via INTRAVENOUS
  Filled 2018-06-05: qty 1

## 2018-06-05 MED ORDER — SODIUM CHLORIDE 0.9 % IV BOLUS
1000.0000 mL | Freq: Once | INTRAVENOUS | Status: AC
  Administered 2018-06-05: 1000 mL via INTRAVENOUS

## 2018-06-05 MED ORDER — ONDANSETRON HCL 4 MG/2ML IJ SOLN
4.0000 mg | Freq: Once | INTRAMUSCULAR | Status: AC
  Administered 2018-06-05: 4 mg via INTRAVENOUS
  Filled 2018-06-05: qty 2

## 2018-06-05 MED ORDER — CIPROFLOXACIN HCL 500 MG PO TABS: 500.0000 mg | ORAL_TABLET | Freq: Two times a day (BID) | ORAL | 0 refills | Status: DC

## 2018-06-05 MED ORDER — ONDANSETRON HCL 4 MG PO TABS: 4.0000 mg | ORAL_TABLET | Freq: Four times a day (QID) | ORAL | 0 refills | Status: DC

## 2018-06-05 MED ORDER — METRONIDAZOLE 500 MG PO TABS: 500.0000 mg | ORAL_TABLET | Freq: Three times a day (TID) | ORAL | 0 refills | Status: DC

## 2018-06-05 MED ORDER — IOPAMIDOL (ISOVUE-300) INJECTION 61%
100.0000 mL | Freq: Once | INTRAVENOUS | Status: AC | PRN
  Administered 2018-06-05: 100 mL via INTRAVENOUS

## 2018-06-05 MED ORDER — HYDROCODONE-ACETAMINOPHEN 5-325 MG PO TABS: 1.0000 | ORAL_TABLET | Freq: Four times a day (QID) | ORAL | 0 refills | Status: DC | PRN

## 2018-06-05 MED ORDER — METRONIDAZOLE 500 MG PO TABS: 500.0000 mg | ORAL_TABLET | Freq: Three times a day (TID) | ORAL | 0 refills | Status: AC

## 2018-06-05 NOTE — ED Notes (Signed)
Pt teaching provided on medications that may cause drowsiness. Pt instructed not to drive or operate heavy machinery while taking the prescribed medication. Pt verbalized understanding.   

## 2018-06-05 NOTE — Discharge Instructions (Addendum)
Follow attached handout.  Please take all of your antibiotics until finished!   You may develop abdominal discomfort or diarrhea from the antibiotic.  You may help offset this with probiotics which you can buy or get in yogurt. Do not eat or take the probiotics until 2 hours after your antibiotic. Do not take your medicine if develop an itchy rash, swelling in your mouth or lips, or difficulty breathing.   Please note that ciprofloxacin can place you at increased risk for tendon injury. Please avoid high impact activities while taking this medication.  Please avoid alcohol while taking flagyl.   Take Zofran as needed for nausea and vomiting.    For breakthrough pain you may take Norco. Do not drink alcohol drive or operate heavy machinery when taking. You are being provided a prescription for opiates (also known as narcotics) for pain control on an as needed basis.  Opiates can be addictive and should only be used when absolutely necessary for pain control when other alternatives do not work.  We recommend you only use them for the recommended amount of time and only as prescribed.  Please do not take with other sedative medications or alcohol.  Please do not drive, operate machinery, or make important decisions while taking opiates.  Please note that these medications can be addictive and have high abuse potential.  Please keep these medications locked away from children, teenagers or any family members with history of substance abuse. Additionally, these medications may cause constipation - take over the counter stool softeners or add fiber to your diet to treat this (Metamucil, Psyllium Fiber, Colace, Miralax) Further refills will need to be obtained from your primary care doctor and will not be prescribed through the Emergency Department. You will test positive on most drug tests while taking this medication.   Follow up with your primary care provider next week.   If you develop worsening or new  concerning symptoms you can return to the emergency department for re-evaluation.

## 2018-06-05 NOTE — ED Triage Notes (Signed)
Lower abd pain since yesterday. States hx of diverticulitis. Reports normal BM today. "I feel like I have the flu"

## 2018-06-05 NOTE — ED Provider Notes (Signed)
[facility] EMERGENCY DEPARTMENT Provider Note   CSN: [id number] Arrival date & time: 06/05/18  1406     History   Chief Complaint Chief Complaint  Patient presents with  . Abdominal Pain    HPI Andrew Reyes is a 57 y.o. male with a history of hypertension, diverticulitis, GERD who presents emergency department today for lower abdominal pain.  Patient reports yesterday started developing diffuse lower abdominal pain that he describes as constant, sharp and rates as a current 3/10.  He notes that nothing makes his symptoms better or worse.  He has not tried any oral medications for his symptoms.  He states this feels similar to when he has had diverticulitis in the past however also reports some urinary frequency, dysuria as well as a painful bowel movement this morning.  He denies any hematuria, penile discharge, testicular pain/swelling, and notes that his only sexual partner is his wife.  No history of STDs in the past.  He denies history of UTI/prostatitis in the past.  Patient reports that his last colonoscopy did show a polyp as well as diverticulosis but was otherwise reassuring.  Patient notes no prior abdominal surgeries.  He denies any fever, chills, upper abdominal pain, flank pain, nausea/vomiting/diarrhea.  He had any bowel movement today after the onset of his pain that was without melena or hematochezia.  HPI  Past Medical History:  Diagnosis Date  . Diverticulitis 05/2013  . GERD (gastroesophageal reflux disease)   . History of colonic polyp - sessile serrated polyp 07/17/2014  . Hypertension     Patient Active Problem List   Diagnosis Date Noted  . Palpitations 10/31/2016  . Shortness of breath 10/31/2016  . History of colonic polyp - sessile serrated polyp 07/17/2014    Past Surgical History:  Procedure Laterality Date  . TONSILLECTOMY  1976        Home Medications    Prior to Admission medications   Medication Sig Start Date End Date Taking?  Authorizing Provider  aspirin EC 81 MG tablet Take 81 mg by mouth daily.   Yes [provider]  Coenzyme Q10 (CO Q 10 PO) Take 1 tablet by mouth daily.   Yes [provider]  lisinopril-hydrochlorothiazide (PRINZIDE,ZESTORETIC) 20-12.5 MG per tablet Take 1 tablet by mouth daily.  04/28/13  Yes [provider]  Multiple Vitamin (MULTIVITAMIN WITH MINERALS) TABS Take 1 tablet by mouth daily.   Yes [provider]  Omega-3 Fatty Acids (FISH OIL BURP-LESS PO) Take 1 capsule by mouth 2 (two) times daily.   Yes [provider]  RABEprazole (ACIPHEX) 20 MG tablet Take 20 mg by mouth daily.   Yes [provider]    Family History Family History  Problem Relation Age of Onset  . Hypertension Mother   . Diabetes Mother   . Stroke Mother   . Hypertension Father   . Diabetes Father   . Colon cancer Neg Hx     Social History Social History   Tobacco Use  . Smoking status: Never Smoker  . Smokeless tobacco: Never Used  Substance Use Topics  . Alcohol use: Yes    Alcohol/week: 6.0 standard drinks    Types: 6 Cans of beer per week    Comment: weekend  . Drug use: No     Allergies   Patient has no known allergies.   Review of Systems Review of Systems  All other systems reviewed and are negative.    Physical Exam Updated Vital  Signs BP 140/78 (BP Location: Left Arm)   Pulse 76   Temp 99.2 F (37.3 C) (Oral)   Resp 18   SpO2 97%   Physical Exam  Constitutional: He appears well-developed and well-nourished.  HENT:  Head: Normocephalic and atraumatic.  Right Ear: External ear normal.  Left Ear: External ear normal.  Nose: Nose normal.  Mouth/Throat: Uvula is midline, oropharynx is clear and moist and mucous membranes are normal. No tonsillar exudate.  Eyes: Pupils are equal, round, and reactive to light. Right eye exhibits no discharge. Left eye exhibits no discharge. No scleral icterus.  Neck: Trachea normal. Neck supple.  No spinous process tenderness present. No neck rigidity. Normal range of motion present.  Cardiovascular: Normal rate, regular rhythm and intact distal pulses.  No murmur heard. Pulses:      Radial pulses are 2+ on the right side, and 2+ on the left side.       Dorsalis pedis pulses are 2+ on the right side, and 2+ on the left side.       Posterior tibial pulses are 2+ on the right side, and 2+ on the left side.  No lower extremity swelling or edema. Calves symmetric in size bilaterally.  Pulmonary/Chest: Effort normal and breath sounds normal. He exhibits no tenderness.  Abdominal: Soft. Bowel sounds are normal. He exhibits no distension. There is tenderness in the right lower quadrant, suprapubic area and left lower quadrant. There is no rigidity, no rebound, no guarding, no CVA tenderness, no tenderness at McBurney's point and negative Murphy's sign.  Musculoskeletal: He exhibits no edema.  Lymphadenopathy:    He has no cervical adenopathy.  Neurological: He is alert.  Skin: Skin is warm and dry. No rash noted. He is not diaphoretic.  Psychiatric: He has a normal mood and affect.  Nursing note and vitals reviewed.    ED Treatments / Results  Labs (all labs ordered are listed, but only abnormal results are displayed) Labs Reviewed  COMPREHENSIVE METABOLIC PANEL - Abnormal; Notable for the following components:      Result Value   Glucose, Bld 123 (*)    ALT 60 (*)    All other components within normal limits  CBC - Abnormal; Notable for the following components:   WBC 12.1 (*)    All other components within normal limits  URINALYSIS, ROUTINE W REFLEX MICROSCOPIC - Abnormal; Notable for the following components:   Hgb urine dipstick TRACE (*)    All other components within normal limits  URINALYSIS, MICROSCOPIC (REFLEX) - Abnormal; Notable for the following components:   Bacteria, UA MANY (*)    All other components within normal limits  URINE CULTURE  LIPASE, BLOOD     EKG None  Radiology Ct Abdomen Pelvis W Contrast  Result Date: 06/05/2018 CLINICAL DATA:  Patient with lower abdominal pain for 2 days. EXAM: CT ABDOMEN AND PELVIS WITH CONTRAST TECHNIQUE: Multidetector CT imaging of the abdomen and pelvis was performed using the standard protocol following bolus administration of intravenous contrast. CONTRAST:  158mL ISOVUE-300 IOPAMIDOL (ISOVUE-300) INJECTION 61% COMPARISON:  CT abdomen pelvis 06/22/2013 FINDINGS: Lower chest: Normal heart size. Dependent atelectasis within the bilateral lower lobes. No pleural effusion. Hepatobiliary: Liver is diffusely low in attenuation compatible steatosis. Gallbladder is unremarkable. No intrahepatic or extrahepatic biliary ductal dilatation. Pancreas: Unremarkable Spleen: Unremarkable Adrenals/Urinary Tract: Normal adrenal glands. Kidneys enhance symmetrically with contrast. No hydronephrosis. Stomach/Bowel: Circumferential wall thickening of the sigmoid colon with surrounding fluid and fat stranding in the setting  of diverticulosis most compatible with acute diverticulitis. Normal morphology of the stomach. No evidence for small bowel obstruction. No free air. Nonspecific calcification within the duodenum. Vascular/Lymphatic: Normal caliber abdominal aorta. Peripheral calcified atherosclerotic plaque. Prominent subcentimeter retroperitoneal lymph nodes. Atherosclerotic narrowing at the origin of the superior mesenteric artery (image 59; series 5). Reproductive: Unremarkable Other: None. Musculoskeletal: No aggressive or acute appearing osseous lesions. Lumbar spine degenerative changes. IMPRESSION: 1. Findings compatible with acute sigmoid diverticulitis. 2. Hepatic steatosis. Electronically Signed   By: Lovey Newcomer M.D.   On: 06/05/2018 16:41    Procedures Procedures (including critical care time)  Medications Ordered in ED Medications  morphine 4 MG/ML injection 4 mg (has no administration in time range)  ondansetron  (ZOFRAN) injection 4 mg (has no administration in time range)     Initial Impression / Assessment and Plan / ED Course  I have reviewed the triage vital signs and the nursing notes.  Pertinent labs & imaging results that were available during my care of the patient were reviewed by me and considered in my medical decision making (see chart for details).     57 y.o. male diagnosed with diverticulitis on Ct scan.  No evidence of abscess or perforation. Labs and imaging reviewed by myself. Patient does not meet SIRS or sepsis criteria. Patient did complain of urinary symptoms. Urine culture sent. Cipro used for diverticulitis would cover for possible uti. Patient given short course of pain medication for home.  Patient reviewed in [place] Controlled Substance Reporting System. Patient tolerating PO fluids in the department. Pain controlled in the department. Repeat abdominal exam prior to discharge without peritoneal signs. I advised the patient to follow-up with PCP next week. Specific return precautions discussed. Time was given for all questions to be answered. The patient verbalized understanding and agreement with plan. The patient appears safe for discharge home.  Final Clinical Impressions(s) / ED Diagnoses   Final diagnoses:  Diverticulitis    ED Discharge Orders         Ordered    metroNIDAZOLE (FLAGYL) 500 MG tablet  3 times daily     06/05/18 1709    ondansetron (ZOFRAN) 4 MG tablet  Every 6 hours     06/05/18 1709    ciprofloxacin (CIPRO) 500 MG tablet  2 times daily     06/05/18 1709    HYDROcodone-acetaminophen (NORCO) 5-325 MG tablet  Every 6 hours PRN     06/05/18 1709           Lorelle Gibbs 06/05/18 1712    Mesner, Corene Cornea, MD 06/05/18 [phone]

## 2018-06-07 LAB — URINE CULTURE: Culture: NO GROWTH

## 2018-06-09 DIAGNOSIS — K5792 Diverticulitis of intestine, part unspecified, without perforation or abscess without bleeding: Secondary | ICD-10-CM | POA: Diagnosis not present

## 2018-08-08 DIAGNOSIS — J029 Acute pharyngitis, unspecified: Secondary | ICD-10-CM | POA: Diagnosis not present

## 2018-08-08 DIAGNOSIS — R51 Headache: Secondary | ICD-10-CM | POA: Diagnosis not present

## 2018-08-08 DIAGNOSIS — R5383 Other fatigue: Secondary | ICD-10-CM | POA: Diagnosis not present

## 2018-08-08 DIAGNOSIS — M791 Myalgia, unspecified site: Secondary | ICD-10-CM | POA: Diagnosis not present

## 2018-08-08 DIAGNOSIS — J0140 Acute pansinusitis, unspecified: Secondary | ICD-10-CM | POA: Diagnosis not present

## 2018-11-25 DIAGNOSIS — I10 Essential (primary) hypertension: Secondary | ICD-10-CM | POA: Diagnosis not present

## 2018-11-25 DIAGNOSIS — K219 Gastro-esophageal reflux disease without esophagitis: Secondary | ICD-10-CM | POA: Diagnosis not present

## 2018-11-25 DIAGNOSIS — E119 Type 2 diabetes mellitus without complications: Secondary | ICD-10-CM | POA: Diagnosis not present

## 2018-11-25 DIAGNOSIS — E7800 Pure hypercholesterolemia, unspecified: Secondary | ICD-10-CM | POA: Diagnosis not present

## 2019-01-03 ENCOUNTER — Encounter: Payer: Self-pay | Admitting: General Surgery

## 2019-01-04 ENCOUNTER — Ambulatory Visit (INDEPENDENT_AMBULATORY_CARE_PROVIDER_SITE_OTHER): Payer: BLUE CROSS/BLUE SHIELD | Admitting: Internal Medicine

## 2019-01-04 ENCOUNTER — Other Ambulatory Visit: Payer: Self-pay

## 2019-01-04 ENCOUNTER — Encounter: Payer: Self-pay | Admitting: Internal Medicine

## 2019-01-04 DIAGNOSIS — Z86010 Personal history of colonic polyps: Secondary | ICD-10-CM | POA: Diagnosis not present

## 2019-01-04 DIAGNOSIS — K5732 Diverticulitis of large intestine without perforation or abscess without bleeding: Secondary | ICD-10-CM | POA: Insufficient documentation

## 2019-01-04 NOTE — Assessment & Plan Note (Signed)
Anticipate recall as planned thopugh advised new guidelines 5-10 years so if delayed due to Covid-19 backlog should be ok

## 2019-01-04 NOTE — Progress Notes (Signed)
    TELEHEALTH ENCOUNTER IN SETTING OF COVID-19 PANDEMIC - REQUESTED BY PATIENT SERVICE PROVIDED BY TELEMEDECINE - TYPE: Webex PATIENT LOCATION: Home PATIENT HAS CONSENTED TO TELEHEALTH VISIT PROVIDER LOCATION: OFFICE TIME SPENT ON CALL:20 mins    Andrew Reyes 58 y.o. 1960-11-20 [id number]  Assessment & Plan:   Diverticulitis of colon - recurrent Episodes 2014 and 2020 - both CT confirmed I do not think needs a colonoscopy now - had one 2015 (dimin ssp + tics) and CT scan now no mass Hi fiber diet recommended SHOULD HE GET HIS SXS AGAIN TOLD HIM HE COULD CALL us AND LIKELY OK BUT NO GUARANTEE WE COULD RX ABX AND HAVE HIM SEE [place] IN F/U RATHER THAN ED VISIT  History of colonic polyp - sessile serrated polyp Anticipate recall as planned thopugh advised new guidelines 5-10 years so if delayed due to Covid-19 backlog should be ok   BC:WUGQBV, Andrew [place], PA-C   Subjective:   Chief Complaint: diverticulitis  HPI 58 yo man for f/u as advised by ED PA - 05/2019 had second occurrence of diverticulitis (first 20140 both confirmed by CT. Colonoscopy results as per A/P 2015. He is well now. 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 ED PA suggested he f/u about colonoscopy now as opposed to later. No GI sxs now except slight soreness LLQ at times, usually when lying on left No Known Allergies Current Meds  Medication Sig  . aspirin EC 81 MG tablet Take 81 mg by mouth daily.  . Coenzyme Q10 (CO Q 10 PO) Take 1 tablet by mouth daily.  Marland Kitchen lisinopril-hydrochlorothiazide (PRINZIDE,ZESTORETIC) 20-12.5 MG per tablet Take 1 tablet by mouth daily.   . Multiple Vitamin (MULTIVITAMIN WITH MINERALS) TABS Take 1 tablet by mouth daily.  . Omega-3 Fatty Acids (FISH OIL BURP-LESS PO) Take 1 capsule by mouth 2 (two) times daily.  . RABEprazole (ACIPHEX) 20 MG tablet Take 20 mg by mouth daily.   Past Medical History:  Diagnosis Date  .  Diverticulitis 05/2013   second episode 2020  . GERD (gastroesophageal reflux disease)   . History of colonic polyp - sessile serrated polyp 07/17/2014  . Hypertension    Past Surgical History:  Procedure Laterality Date  . COLONOSCOPY  2015  . TONSILLECTOMY  1976   Social History   Social History Narrative   Married   Museum/gallery conservator   Some EtOH weekend beer    Never smoker/drugs   family history includes Diabetes in his father and mother; Hypertension in his father and mother; Stroke in his mother.   Review of Systems As per HPI, all other negative

## 2019-01-04 NOTE — Assessment & Plan Note (Signed)
Episodes 2014 and 2020 - both CT confirmed I do not think needs a colonoscopy now - had one 2015 (dimin ssp + tics) and CT scan now no mass Hi fiber diet recommended SHOULD HE GET HIS SXS AGAIN TOLD HIM HE COULD CALL us AND LIKELY OK BUT NO GUARANTEE WE COULD RX ABX AND HAVE HIM SEE [place] IN F/U RATHER THAN ED VISIT

## 2019-01-04 NOTE — Patient Instructions (Signed)
Glad your doing well now. If you have symptoms please call us instead of going to the emergency room.  We are mailing you a handout to read on diverticulosis and diverticulitis to read.   I appreciate the opportunity to care for you. Silvano Rusk, MD, [facility]

## 2019-05-25 DIAGNOSIS — I10 Essential (primary) hypertension: Secondary | ICD-10-CM | POA: Diagnosis not present

## 2019-05-25 DIAGNOSIS — Z13220 Encounter for screening for lipoid disorders: Secondary | ICD-10-CM | POA: Diagnosis not present

## 2019-05-25 DIAGNOSIS — Z0000 Encounter for general adult medical examination without abnormal findings: Secondary | ICD-10-CM | POA: Diagnosis not present

## 2019-05-25 DIAGNOSIS — E119 Type 2 diabetes mellitus without complications: Secondary | ICD-10-CM | POA: Diagnosis not present

## 2019-06-21 ENCOUNTER — Encounter: Payer: Self-pay | Admitting: Internal Medicine

## 2019-08-22 ENCOUNTER — Encounter: Payer: Self-pay | Admitting: Physician Assistant

## 2019-08-22 ENCOUNTER — Other Ambulatory Visit: Payer: Self-pay

## 2019-08-22 ENCOUNTER — Ambulatory Visit (INDEPENDENT_AMBULATORY_CARE_PROVIDER_SITE_OTHER): Payer: BC Managed Care – PPO | Admitting: Physician Assistant

## 2019-08-22 ENCOUNTER — Ambulatory Visit: Payer: Self-pay

## 2019-08-22 DIAGNOSIS — M549 Dorsalgia, unspecified: Secondary | ICD-10-CM | POA: Diagnosis not present

## 2019-08-22 MED ORDER — CYCLOBENZAPRINE HCL 10 MG PO TABS: 10.0000 mg | ORAL_TABLET | Freq: Three times a day (TID) | ORAL | 0 refills | Status: DC | PRN

## 2019-08-22 MED ORDER — TRAMADOL HCL 50 MG PO TABS: 50.0000 mg | ORAL_TABLET | Freq: Four times a day (QID) | ORAL | 0 refills | Status: AC | PRN

## 2019-08-22 MED ORDER — METHYLPREDNISOLONE 4 MG PO TABS: ORAL_TABLET | ORAL | 0 refills | Status: DC

## 2019-08-22 NOTE — Progress Notes (Signed)
Office Visit Note   Patient: Andrew Reyes           Date of Birth: 1961-03-07           MRN: [id number] Visit Date: 08/22/2019              Requested by: Corine Shelter, PA-C [address] [place],  [place] [address] PCP: Corine Shelter, PA-C   Assessment & Plan: Visit Diagnoses:  1. Upper back pain     Plan: Due to the fact the patient is having worsening waking pain recommend MRI thoracic spine to rule out HNP as a source of his pain.  Placed him on a Medrol Dosepak please take no NSAIDs while on the Dosepak.  He is to call us if he is continuing to have pain once he is finished with Medrol Dosepak and has not undergone the MRI at that point so we can call in an NSAID.  Told him that I do not want to go back to taking 4 Aleve once daily..  Also will place him on Flexeril and given tramadol for pain.  He is reminded to not drive on the Flexeril or the tramadol or operate dangerous machinery.  Follow-Up Instructions: Return After MRI.   Orders:  Orders Placed This Encounter  Procedures  . XR Thoracic Spine 2 View   Meds ordered this encounter  Medications  . methylPREDNISolone (MEDROL) 4 MG tablet    Sig: Take as directed    Dispense:  21 tablet    Refill:  0  . cyclobenzaprine (FLEXERIL) 10 MG tablet    Sig: Take 1 tablet (10 mg total) by mouth 3 (three) times daily as needed for muscle spasms.    Dispense:  30 tablet    Refill:  0  . traMADol (ULTRAM) 50 MG tablet    Sig: Take 1 tablet (50 mg total) by mouth every 6 (six) hours as needed for up to 5 days.    Dispense:  20 tablet    Refill:  0      Procedures: No procedures performed   Clinical Data: No additional findings.   Subjective: Chief Complaint  Patient presents with  . Middle Back - Pain    HPI Mr. Purdue is a 58 year old male were seen for the first time for back pain that is been ongoing for the past 2 months.  The patient states his back pain is on the left side below his scapula and radiates  around to his left ribs.  He describes the pain as knifelike.  No known injury.  Pains have been ongoing for the past 2 months.  Worse over the last week.  Does awaken him.  He describes a knifelike pain from the back to the anterior chest.  He has been taking Aleve 4 to 5 tablets once daily.  He has tried heat to his low back.  He continues to work as a Doctor, general practice.  States the pain makes him almost nauseous.  Denies any typical chest pain.  Denies any radicular symptoms either leg or upper extremities.  Review of Systems Denies any fevers, chills, shortness of breath, weight changes, bowel or bladder dysfunction.  Please see HPI otherwise negative or noncontributory.  Objective: Vital Signs: There were no vitals taken for this visit.  Physical Exam Constitutional:      Appearance: He is not ill-appearing or diaphoretic.  Pulmonary:     Effort: Pulmonary effort is normal.  Neurological:  Mental Status: He is alert and oriented to person, place, and time.  Psychiatric:        Mood and Affect: Mood normal.     Ortho Exam Has tenderness with palpation left paraspinous region at the level of T7-T8.  There is no rashes skin lesions ulcerations in this area.  Slight tenderness medial border of the scapula on the left only.  Negative straight leg raise bilaterally.  Deep tendon reflexes are 2+ at the knees and ankles and equal and symmetric. Specialty Comments:  No specialty comments available.  Imaging: Xr Thoracic Spine 2 View  Result Date: 08/22/2019 Thoracic spine 2 views: No acute fracture.  Slight narrowing of disc space at T7-T8 and T8-9.  No spondylolisthesis.  No bony abnormalities otherwise.    PMFS History: Patient Active Problem List   Diagnosis Date Noted  . Diverticulitis of colon - recurrent 01/04/2019  . Palpitations 10/31/2016  . Shortness of breath 10/31/2016  . History of colonic polyp - sessile serrated polyp 07/17/2014   Past Medical History:  Diagnosis  Date  . Diverticulitis 05/2013   second episode 2020  . GERD (gastroesophageal reflux disease)   . History of colonic polyp - sessile serrated polyp 07/17/2014  . Hypertension     Family History  Problem Relation Age of Onset  . Hypertension Mother   . Diabetes Mother   . Stroke Mother   . Hypertension Father   . Diabetes Father   . Colon cancer Neg Hx     Past Surgical History:  Procedure Laterality Date  . COLONOSCOPY  2015  . TONSILLECTOMY  1976   Social History   Occupational History  . Occupation: Doctor, general practice  Tobacco Use  . Smoking status: Never Smoker  . Smokeless tobacco: Never Used  Substance and Sexual Activity  . Alcohol use: Yes    Alcohol/week: 6.0 standard drinks    Types: 6 Cans of beer per week    Comment: weekend  . Drug use: No  . Sexual activity: Not on file

## 2019-09-09 ENCOUNTER — Other Ambulatory Visit: Payer: Self-pay | Admitting: Physician Assistant

## 2019-09-09 DIAGNOSIS — M545 Low back pain, unspecified: Secondary | ICD-10-CM

## 2019-09-09 DIAGNOSIS — G8929 Other chronic pain: Secondary | ICD-10-CM

## 2019-09-11 ENCOUNTER — Ambulatory Visit (INDEPENDENT_AMBULATORY_CARE_PROVIDER_SITE_OTHER): Payer: BC Managed Care – PPO

## 2019-09-11 ENCOUNTER — Other Ambulatory Visit: Payer: Self-pay

## 2019-09-11 DIAGNOSIS — M545 Low back pain, unspecified: Secondary | ICD-10-CM

## 2019-09-11 DIAGNOSIS — G8929 Other chronic pain: Secondary | ICD-10-CM | POA: Diagnosis not present

## 2019-09-11 DIAGNOSIS — M546 Pain in thoracic spine: Secondary | ICD-10-CM | POA: Diagnosis not present

## 2019-09-12 ENCOUNTER — Encounter: Payer: Self-pay | Admitting: Physician Assistant

## 2019-09-12 ENCOUNTER — Ambulatory Visit: Payer: BC Managed Care – PPO | Admitting: Physician Assistant

## 2019-09-12 VITALS — Ht 73.0 in | Wt 252.0 lb

## 2019-09-12 DIAGNOSIS — M546 Pain in thoracic spine: Secondary | ICD-10-CM

## 2019-09-12 NOTE — Progress Notes (Signed)
HPI: Andrew Reyes returns today to go over the MRI of his thoracic spine.  Continues to have severe pain in his back radiates to his anterior chest.  He is having difficulty sleeping due to the pain.  He describes the pain as knifelike pain from the back to the anterior chest.  He was switched to the suite CBD Gummies from pain meds for control of his pain.  MRI of the thoracic spine is reviewed with the patient.  MRI dated 09/11/2019 showed moderate foraminal narrowing at T2-T3 on the left.  This is secondary to advanced facet arthritis at this level. Small central disc protrusion at C7/T1.  Impression: Left T2-3 foraminal stenosis  Plan: We will send him for a left translaminar T2-T3 steroid injection.  See him back in 2 weeks after the injection to see what type of response he had.  Questions were encouraged and answered

## 2019-09-13 ENCOUNTER — Other Ambulatory Visit: Payer: Self-pay | Admitting: Radiology

## 2019-09-13 DIAGNOSIS — M546 Pain in thoracic spine: Secondary | ICD-10-CM

## 2019-09-14 ENCOUNTER — Telehealth: Payer: Self-pay | Admitting: Radiology

## 2019-09-14 NOTE — Telephone Encounter (Signed)
Patients wife has called office multiple times today.  It has been explained to her that insurance has currently denied authorization for injection, and that a peer-to-peer needs to be done by ordering provider due to no physical therapy or conservative treatment. Artis Delay is not back in office until 12/29. Even with referring to another facility, we would still have to wait for the peer-to-peer. Contact number has been sent to [facility].

## 2019-09-14 NOTE — Telephone Encounter (Signed)
Patients wife called back again, was transferred to me this time. Wife stated she has called insurance who has no reference of PA information, informed her of nurse and reference number that [place] spoke with this morning.  Again informed her a peer-to-peer is needed and Artis Delay is not in office again until 12/29. Advised I could send message to Dr. Ninfa Linden about sending in medication for pain, she stated patient has something already, advised if pain seems to be severe enough may need to go to urgent care or emergency room. However we cannot do any injection until after peer-to-peer is done and approval is authorized.  From: Ailene Rud, NT Sent: 09/14/2019  10:22 AM EST To: Ozzie Hoyle, RT Subject: Please Advise                                  Spoke with [facility] nurse reviewer and she states PA is needed for [phone], initial pa was denied from patient not having PT and 6 weeks or more of conservative treatment. She also states that Artis Delay would need to call [phone] by 09/29/2019. I faxed clinical notes to [phone].

## 2019-09-20 ENCOUNTER — Telehealth: Payer: Self-pay | Admitting: Orthopaedic Surgery

## 2019-09-20 ENCOUNTER — Telehealth: Payer: Self-pay | Admitting: Radiology

## 2019-09-20 NOTE — Telephone Encounter (Signed)
Wife has also spoke with [facility] today as well. Spoke with [place]. Injection was authorized, she scheduled patient with Dr. Ernestina Patches on 10/06/19.

## 2019-09-20 NOTE — Telephone Encounter (Signed)
Pt wife called in requesting a call back from [place] asap, pt wife didn't want to speak to me.   [phone]

## 2019-09-20 NOTE — Telephone Encounter (Signed)
Spoke with [place]. Patient has been authorized for injection. Scheduled with Dr. Ernestina Patches 10/06/19

## 2019-09-21 ENCOUNTER — Telehealth: Payer: Self-pay | Admitting: Internal Medicine

## 2019-09-21 NOTE — Telephone Encounter (Signed)
Left message for patient to call back  

## 2019-09-22 ENCOUNTER — Other Ambulatory Visit: Payer: Self-pay | Admitting: *Deleted

## 2019-09-22 MED ORDER — AMOXICILLIN-POT CLAVULANATE 875-125 MG PO TABS: 1.0000 | ORAL_TABLET | Freq: Two times a day (BID) | ORAL | 0 refills | Status: AC

## 2019-09-22 NOTE — Telephone Encounter (Signed)
Augmentin script sent to pharmacy as written below. Patient notified.   Patient aware to call back if he is failing to improve or worsen.   Patient scheduled for f/u with Alonza Bogus on 10/07/19 at 2:00 pm. Patient aware.

## 2019-09-22 NOTE — Telephone Encounter (Signed)
Pt's wife returned call  

## 2019-09-22 NOTE — Telephone Encounter (Signed)
OK  Please rx Augmentin 875 mg bid x 10 days  If he is failing to improve or worsens call back  We should see him in f/u to make sure he is ok  Would use next avail APP visit

## 2019-09-22 NOTE — Telephone Encounter (Signed)
Spoke to the patient who stated he can tell he is starting to have a "diverticulitis flare up." He stated he is so familiar with these epidsode that he suspects that this what this is. He reports left sided pain radiating toward his back that started 3-5 days ago. Afebrile (although he stated he usual does not develop a fever during a flare up). He wants to get ahead of this so he does not end up the the ED over the holiday. He is requesting a prescription for antibiotics to be called in to his pharmacy. Please advise.

## 2019-09-26 ENCOUNTER — Telehealth: Payer: Self-pay | Admitting: Physician Assistant

## 2019-09-26 ENCOUNTER — Other Ambulatory Visit: Payer: Self-pay | Admitting: Physician Assistant

## 2019-09-26 DIAGNOSIS — M546 Pain in thoracic spine: Secondary | ICD-10-CM

## 2019-09-26 NOTE — Telephone Encounter (Signed)
Patient is requesting a call back from PA [facility]. Patient says it's urgent. Patient did not explain what the call back was for.

## 2019-09-26 NOTE — Telephone Encounter (Signed)
Patient only wishes to speak to you

## 2019-09-27 ENCOUNTER — Ambulatory Visit
Admission: RE | Admit: 2019-09-27 | Discharge: 2019-09-27 | Disposition: A | Discharge status: Home / Self Care | Payer: 59 | Source: Ambulatory Visit | Attending: Physician Assistant | Admitting: Physician Assistant

## 2019-09-27 ENCOUNTER — Other Ambulatory Visit: Payer: Self-pay

## 2019-09-27 DIAGNOSIS — M546 Pain in thoracic spine: Secondary | ICD-10-CM

## 2019-09-27 MED ORDER — IOPAMIDOL (ISOVUE-M 300) INJECTION 61%
1.0000 mL | Freq: Once | INTRAMUSCULAR | Status: AC | PRN
  Administered 2019-09-27: 1 mL via EPIDURAL

## 2019-09-27 MED ORDER — TRIAMCINOLONE ACETONIDE 40 MG/ML IJ SUSP (RADIOLOGY)
60.0000 mg | Freq: Once | INTRAMUSCULAR | Status: AC
  Administered 2019-09-27: 60 mg via EPIDURAL

## 2019-09-27 NOTE — Discharge Instructions (Signed)

## 2019-09-29 ENCOUNTER — Other Ambulatory Visit: Payer: BC Managed Care – PPO

## 2019-09-30 ENCOUNTER — Encounter: Payer: Self-pay | Admitting: *Deleted

## 2019-10-03 ENCOUNTER — Other Ambulatory Visit: Payer: Self-pay

## 2019-10-03 ENCOUNTER — Telehealth: Payer: Self-pay | Admitting: Physician Assistant

## 2019-10-03 DIAGNOSIS — M549 Dorsalgia, unspecified: Secondary | ICD-10-CM

## 2019-10-03 DIAGNOSIS — M546 Pain in thoracic spine: Secondary | ICD-10-CM

## 2019-10-03 NOTE — Telephone Encounter (Signed)
Please advise 

## 2019-10-03 NOTE — Telephone Encounter (Signed)
Would send him to neurosurgery. No does not appoint with [facility] .

## 2019-10-03 NOTE — Telephone Encounter (Signed)
Patient's wife Stanton Kidney called advised the injection did not work and wanted to see what is the next plan of care. Mary asked if patient still need to see Dr. Ernestina Patches? The number to contact Stanton Kidney is [phone]

## 2019-10-03 NOTE — Telephone Encounter (Signed)
Patient aware of the below message  

## 2019-10-06 ENCOUNTER — Encounter: Payer: BC Managed Care – PPO | Admitting: Physical Medicine and Rehabilitation

## 2019-10-07 ENCOUNTER — Ambulatory Visit: Payer: 59 | Admitting: Gastroenterology

## 2019-10-07 ENCOUNTER — Encounter: Payer: Self-pay | Admitting: Gastroenterology

## 2019-10-07 VITALS — BP 130/80 | HR 74 | Temp 97.4°F | Ht 73.0 in | Wt 264.0 lb

## 2019-10-07 DIAGNOSIS — K5732 Diverticulitis of large intestine without perforation or abscess without bleeding: Secondary | ICD-10-CM | POA: Diagnosis not present

## 2019-10-07 DIAGNOSIS — Z01818 Encounter for other preprocedural examination: Secondary | ICD-10-CM | POA: Insufficient documentation

## 2019-10-07 DIAGNOSIS — Z86010 Personal history of colonic polyps: Secondary | ICD-10-CM | POA: Diagnosis not present

## 2019-10-07 MED ORDER — SUPREP BOWEL PREP KIT 17.5-3.13-1.6 GM/177ML PO SOLN: 1.0000 | ORAL | 0 refills | Status: DC

## 2019-10-07 NOTE — Progress Notes (Signed)
10/07/2019 Andrew Reyes [id number] Apr 26, 1961   HISTORY OF PRESENT ILLNESS: This is a pleasant 59 year old male who is a patient of Dr. Celesta Aver.  He is here today for follow-up as he was recently treated for diverticulitis.  He called our office and was given a 10-day course of Augmentin for his symptoms.  He has had documented diverticulitis in the past radiographically on CT scan in 2019 and 2014.  He says that he completed the antibiotics this past Sunday and is feeling much better.  No pain at all.  He was actually due for colonoscopy in October 2020.  His last colonoscopy was in October 2015 at which time he had one sessile serrated polyp removed.  Also had diverticulosis.   Past Medical History:  Diagnosis Date  . Diverticulitis 05/2013   second episode 2020  . GERD (gastroesophageal reflux disease)   . Hepatic steatosis   . History of colonic polyp - sessile serrated polyp 07/17/2014  . Hypertension   . Serrated polyp of colon    Past Surgical History:  Procedure Laterality Date  . COLONOSCOPY  2015  . TONSILLECTOMY  1976    reports that he has never smoked. He has never used smokeless tobacco. He reports current alcohol use of about 6.0 standard drinks of alcohol per week. He reports that he does not use drugs. family history includes Diabetes in his father and mother; Hypertension in his father and mother; Stroke in his mother. No Known Allergies    Outpatient Encounter Medications as of 10/07/2019  Medication Sig  . aspirin EC 81 MG tablet Take 81 mg by mouth daily.  . Coenzyme Q10 (CO Q 10 PO) Take 1 tablet by mouth daily.  Marland Kitchen lisinopril-hydrochlorothiazide (PRINZIDE,ZESTORETIC) 20-12.5 MG per tablet Take 1 tablet by mouth daily.   . Multiple Vitamin (MULTIVITAMIN WITH MINERALS) TABS Take 1 tablet by mouth daily.  . Omega-3 Fatty Acids (FISH OIL BURP-LESS PO) Take 1 capsule by mouth 2 (two) times daily.  . RABEprazole (ACIPHEX) 20 MG tablet Take 20 mg by mouth  daily.  . [DISCONTINUED] cyclobenzaprine (FLEXERIL) 10 MG tablet Take 1 tablet (10 mg total) by mouth 3 (three) times daily as needed for muscle spasms.  . [DISCONTINUED] methylPREDNISolone (MEDROL) 4 MG tablet Take as directed (Patient not taking: Reported on 09/12/2019)   No facility-administered encounter medications on file as of 10/07/2019.     REVIEW OF SYSTEMS  : All other systems reviewed and negative except where noted in the History of Present Illness.   PHYSICAL EXAM: BP 130/80   Pulse 74   Temp (!) 97.4 F (36.3 C)   Ht 6\' 1"  (1.854 m)   Wt 264 lb (119.7 kg)   BMI 34.83 kg/m  General: Well developed white male in no acute distress Head: Normocephalic and atraumatic Eyes:  Sclerae anicteric, conjunctiva pink. Ears: Normal auditory acuity  Lungs: Clear throughout to auscultation; no increased WOB. Heart: Regular rate and rhythm; no M/R/G. Abdomen: Soft, non-distended.  BS present.  Non-tender. Rectal:  Will be done at the time of colonoscopy. Musculoskeletal: Symmetrical with no gross deformities  Skin: No lesions on visible extremities Extremities: No edema  Neurological: Alert oriented x 4, grossly non-focal Psychological:  Alert and cooperative. Normal mood and affect  ASSESSMENT AND PLAN: *Recurrent diverticulitis: Resolved with 10 days of Augmentin.  Feeling much better. *Personal history of adenomatous colon polyps: Was due for colonoscopy in October 2020.  Will schedule for colonoscopy with Dr. Carlean Purl about  6 weeks out from now.  **The risks, benefits, and alternatives to colonoscopy were discussed with the patient and he consents to proceed.   CC:  Corine Shelter, PA-C

## 2019-10-07 NOTE — Patient Instructions (Signed)
You have been scheduled for a colonoscopy. Please follow written instructions given to you at your visit today.  Please pick up your prep supplies at the pharmacy within the next 1-3 days. If you use inhalers (even only as needed), please bring them with you on the day of your procedure.  If you are age 59 or older, your body mass index should be between 23-30. Your Body mass index is 34.83 kg/m. If this is out of the aforementioned range listed, please consider follow up with your Primary Care Provider.  If you are age 59 or younger, your body mass index should be between 19-25. Your Body mass index is 34.83 kg/m. If this is out of the aformentioned range listed, please consider follow up with your Primary Care Provider.   Due to recent changes in healthcare laws, you may see the results of your imaging and laboratory studies on MyChart before your provider has had a chance to review them.  We understand that in some cases there may be results that are confusing or concerning to you. Not all laboratory results come back in the same time frame and the provider may be waiting for multiple results in order to interpret others.  Please give [place] 48 hours in order for your provider to thoroughly review all the results before contacting the office for clarification of your results.

## 2019-10-20 ENCOUNTER — Ambulatory Visit: Payer: BC Managed Care – PPO | Admitting: Gastroenterology

## 2019-10-31 ENCOUNTER — Other Ambulatory Visit: Payer: Self-pay | Admitting: Radiology

## 2019-10-31 ENCOUNTER — Telehealth: Payer: Self-pay | Admitting: Orthopaedic Surgery

## 2019-10-31 DIAGNOSIS — M546 Pain in thoracic spine: Secondary | ICD-10-CM

## 2019-10-31 DIAGNOSIS — M549 Dorsalgia, unspecified: Secondary | ICD-10-CM

## 2019-10-31 NOTE — Telephone Encounter (Signed)
New referral placed.  S/w Stanton Kidney, confirmed patient was seen by Dr. Kathyrn Sheriff in January. Symptoms are still increasing and Dr. Vertell Limber was recommended. Advised Dr. Vertell Limber was in the same group with Dr. Kathyrn Sheriff and can try to call facility to be scheduled since already an established patient with facility. Advised I would still go ahead and send in referral for Dr. Vertell Limber in case needed.

## 2019-10-31 NOTE — Telephone Encounter (Signed)
Can we refer him to Dr. Vertell Limber Wife states he's in incredible pain

## 2019-10-31 NOTE — Telephone Encounter (Signed)
That is fine I thought we had already referred him to neurosurgery

## 2019-10-31 NOTE — Telephone Encounter (Signed)
Patient's wife Andrew Reyes called requesting a referral for husband to be seen by Dr. Vertell Limber at [facility]. Merlin Blossom for back pains. Andrew Reyes states husband was seen by Benita Stabile and Carlis Abbott referred patient to see [facility]. Please give Andrew Reyes a call for more information needed. Patient's wife phone number is [phone].

## 2019-11-14 ENCOUNTER — Telehealth: Payer: Self-pay | Admitting: Internal Medicine

## 2019-11-14 ENCOUNTER — Ambulatory Visit (INDEPENDENT_AMBULATORY_CARE_PROVIDER_SITE_OTHER): Payer: 59

## 2019-11-14 ENCOUNTER — Other Ambulatory Visit: Payer: Self-pay | Admitting: Internal Medicine

## 2019-11-14 DIAGNOSIS — Z1159 Encounter for screening for other viral diseases: Secondary | ICD-10-CM

## 2019-11-14 NOTE — Telephone Encounter (Signed)
I spoke with Kartikeya and he is going to check with the pharmacy and pay cash for the suprep if not too expensive.  He also wants me to send him miralax instructions incase its too expensive. I will email these to him . I confirmed his email address.

## 2019-11-16 LAB — SARS CORONAVIRUS 2 (TAT 6-24 HRS): SARS Coronavirus 2: NEGATIVE

## 2019-11-17 ENCOUNTER — Ambulatory Visit (AMBULATORY_SURGERY_CENTER): Payer: 59 | Admitting: Internal Medicine

## 2019-11-17 ENCOUNTER — Encounter: Payer: Self-pay | Admitting: Internal Medicine

## 2019-11-17 ENCOUNTER — Encounter (HOSPITAL_BASED_OUTPATIENT_CLINIC_OR_DEPARTMENT_OTHER): Payer: Self-pay

## 2019-11-17 ENCOUNTER — Emergency Department (HOSPITAL_BASED_OUTPATIENT_CLINIC_OR_DEPARTMENT_OTHER)
Admission: EM | Admit: 2019-11-17 | Discharge: 2019-11-17 | Disposition: A | Discharge status: Home / Self Care | Payer: 59 | Attending: Emergency Medicine | Admitting: Emergency Medicine

## 2019-11-17 ENCOUNTER — Telehealth: Payer: Self-pay | Admitting: Internal Medicine

## 2019-11-17 ENCOUNTER — Other Ambulatory Visit: Payer: Self-pay

## 2019-11-17 VITALS — BP 112/65 | HR 56 | Temp 96.6°F | Resp 16 | Ht 73.0 in | Wt 264.0 lb

## 2019-11-17 DIAGNOSIS — R1030 Lower abdominal pain, unspecified: Secondary | ICD-10-CM | POA: Diagnosis not present

## 2019-11-17 DIAGNOSIS — I10 Essential (primary) hypertension: Secondary | ICD-10-CM | POA: Diagnosis not present

## 2019-11-17 DIAGNOSIS — Z86010 Personal history of colonic polyps: Secondary | ICD-10-CM

## 2019-11-17 DIAGNOSIS — R300 Dysuria: Secondary | ICD-10-CM | POA: Diagnosis present

## 2019-11-17 DIAGNOSIS — Z7982 Long term (current) use of aspirin: Secondary | ICD-10-CM | POA: Diagnosis not present

## 2019-11-17 DIAGNOSIS — K5732 Diverticulitis of large intestine without perforation or abscess without bleeding: Secondary | ICD-10-CM

## 2019-11-17 DIAGNOSIS — Z79899 Other long term (current) drug therapy: Secondary | ICD-10-CM | POA: Diagnosis not present

## 2019-11-17 LAB — URINALYSIS, ROUTINE W REFLEX MICROSCOPIC
Bilirubin Urine: NEGATIVE
Glucose, UA: NEGATIVE mg/dL
Hgb urine dipstick: NEGATIVE
Ketones, ur: NEGATIVE mg/dL
Leukocytes,Ua: NEGATIVE
Nitrite: NEGATIVE
Protein, ur: NEGATIVE mg/dL
Specific Gravity, Urine: 1.01 (ref 1.005–1.030)
pH: 6 (ref 5.0–8.0)

## 2019-11-17 MED ORDER — SODIUM CHLORIDE 0.9 % IV SOLN: 500.0000 mL | Freq: Once | INTRAVENOUS | Status: DC

## 2019-11-17 MED ORDER — AMOXICILLIN-POT CLAVULANATE 875-125 MG PO TABS: 1.0000 | ORAL_TABLET | Freq: Two times a day (BID) | ORAL | 0 refills | Status: DC

## 2019-11-17 NOTE — Telephone Encounter (Signed)
Uneventful colonoscopy today Now w/ dysuria and groin pain No gross hematuria  Advised go to ED for evaluation and treatment ? If has a kidney stone attack

## 2019-11-17 NOTE — Patient Instructions (Addendum)
No polyps today so next routine colonoscopy or other screening test in 10 years - 2031  I see the diverticulosis - no diverticulitis.  Given the attacks you have I am sending a prescription for you to keep on hand - antibiotics.  I ask that if you have an attack and need to take them please let me know that you are doing so.  If you keep having attacks may consider surgery to remove part of the colon.  I appreciate the opportunity to care for you. Gatha Mayer, MD, [facility]  Handout on diverticulosis given.  YOU HAD AN ENDOSCOPIC PROCEDURE TODAY AT [facility]:   Refer to the procedure report that was given to you for any specific questions about what was found during the examination.  If the procedure report does not answer your questions, please call your gastroenterologist to clarify.  If you requested that your care partner not be given the details of your procedure findings, then the procedure report has been included in a sealed envelope for you to review at your convenience later.  YOU SHOULD EXPECT: Some feelings of bloating in the abdomen. Passage of more gas than usual.  Walking can help get rid of the air that was put into your GI tract during the procedure and reduce the bloating. If you had a lower endoscopy (such as a colonoscopy or flexible sigmoidoscopy) you may notice spotting of blood in your stool or on the toilet paper. If you underwent a bowel prep for your procedure, you may not have a normal bowel movement for a few days.  Please Note:  You might notice some irritation and congestion in your nose or some drainage.  This is from the oxygen used during your procedure.  There is no need for concern and it should clear up in a day or so.  SYMPTOMS TO REPORT IMMEDIATELY:   Following lower endoscopy (colonoscopy or flexible sigmoidoscopy):  Excessive amounts of blood in the stool  Significant tenderness or worsening of abdominal pains  Swelling of the  abdomen that is new, acute  Fever of 100F or higher  For urgent or emergent issues, a gastroenterologist can be reached at any hour by calling [phone].   DIET:  We do recommend a small meal at first, but then you may proceed to your regular diet.  Drink plenty of fluids but you should avoid alcoholic beverages for 24 hours.  ACTIVITY:  You should plan to take it easy for the rest of today and you should NOT DRIVE or use heavy machinery until tomorrow (because of the sedation medicines used during the test).    FOLLOW UP: Our staff will call the number listed on your records 48-72 hours following your procedure to check on you and address any questions or concerns that you may have regarding the information given to you following your procedure. If we do not reach you, we will leave a message.  We will attempt to reach you two times.  During this call, we will ask if you have developed any symptoms of COVID 19. If you develop any symptoms (ie: fever, flu-like symptoms, shortness of breath, cough etc.) before then, please call [phone].  If you test positive for Covid 19 in the 2 weeks post procedure, please call and report this information to [place].    If any biopsies were taken you will be contacted by phone or by letter within the next 1-3 weeks.  Please call us at [phone] if you have not heard about the biopsies in 3 weeks.    SIGNATURES/CONFIDENTIALITY: You and/or your care partner have signed paperwork which will be entered into your electronic medical record.  These signatures attest to the fact that that the information above on your After Visit Summary has been reviewed and is understood.  Full responsibility of the confidentiality of this discharge information lies with you and/or your care-partner.

## 2019-11-17 NOTE — Progress Notes (Signed)
Report given to PACU, vss 

## 2019-11-17 NOTE — ED Triage Notes (Signed)
Pt c/o dysuria started ~2pm-states he had a colonoscopy today-called GI ans was advised "they might have dislodges a kidney stone"-NAD-steady gait

## 2019-11-17 NOTE — Op Note (Signed)
[facility] Patient Name: Andrew Reyes Procedure Date: 11/17/2019 10:27 AM MRN: [id number] Endoscopist: Gatha Mayer , MD Age: 59 Referring MD:  Date of Birth: 18-Apr-1961 Gender: Male Account #: [id number] Procedure:                Colonoscopy Indications:              High risk colon cancer surveillance: Personal                            history of sessile serrated colon polyp (less than                            10 mm in size) with no dysplasia, Last colonoscopy:                            2015 Medicines:                Propofol per Anesthesia, Monitored Anesthesia Care Procedure:                Pre-Anesthesia Assessment:                           - Prior to the procedure, a History and Physical                            was performed, and patient medications and                            allergies were reviewed. The patient's tolerance of                            previous anesthesia was also reviewed. The risks                            and benefits of the procedure and the sedation                            options and risks were discussed with the patient.                            All questions were answered, and informed consent                            was obtained. Prior Anticoagulants: The patient has                            taken no previous anticoagulant or antiplatelet                            agents. ASA Grade Assessment: II - A patient with                            mild systemic disease. After reviewing the risks  and benefits, the patient was deemed in                            satisfactory condition to undergo the procedure.                           After obtaining informed consent, the colonoscope                            was passed under direct vision. Throughout the                            procedure, the patient's blood pressure, pulse, and                            oxygen saturations were monitored  continuously. The                            Colonoscope was introduced through the anus and                            advanced to the the cecum, identified by                            appendiceal orifice and ileocecal valve. The                            colonoscopy was somewhat difficult due to                            significant looping. Successful completion of the                            procedure was aided by applying abdominal pressure.                            The patient tolerated the procedure well. The                            quality of the bowel preparation was excellent. The                            ileocecal valve, appendiceal orifice, and rectum                            were photographed. The bowel preparation used was                            Miralax via split dose instruction. Scope In: 10:42:25 AM Scope Out: 11:00:26 AM Scope Withdrawal Time: 0 hours 8 minutes 16 seconds  Total Procedure Duration: 0 hours 18 minutes 1 second  Findings:                 The perianal and digital rectal examinations were  normal. Pertinent negatives include normal prostate                            (size, shape, and consistency).                           Multiple small and large-mouthed diverticula were                            found in the sigmoid colon.                           The exam was otherwise without abnormality on                            direct and retroflexion views. Complications:            No immediate complications. Estimated Blood Loss:     Estimated blood loss: none. Impression:               - Diverticulosis in the sigmoid colon.                           - The examination was otherwise normal on direct                            and retroflexion views.                           - No specimens collected.                           - Personal history of colonic polyp 4 mm sessile                            serrated  polyp 2015. Recommendation:           - Patient has a contact number available for                            emergencies. The signs and symptoms of potential                            delayed complications were discussed with the                            patient. Return to normal activities tomorrow.                            Written discharge instructions were provided to the                            patient.                           - Resume previous diet.                           -  Continue present medications.                           - Repeat colonoscopy in 10 years.                           - I Rxed generic Augmentin 875 mg to keep on hand                            in case of further diverticulitis attacks                           If he gets more attacks consider elective segmental                            colectomy Gatha Mayer, MD 11/17/2019 11:11:19 AM This report has been signed electronically.

## 2019-11-17 NOTE — ED Provider Notes (Signed)
[facility] EMERGENCY DEPARTMENT Provider Note   CSN: [id number] Arrival date & time: 11/17/19  1923     History Chief Complaint  Patient presents with  . Dysuria    Andrew Reyes is a 59 y.o. male.  HPI 59 year old male presents with dysuria.  Patient had a colonoscopy today.  When he got home he went to urinate and found it difficult and it was painful.  He had pain in his lower abdomen when urinating.  Was only able to urinate small amounts frequently and each time it hurt.  Eventually when he got here he felt like his urine emptied a lot more.  Still hurt but a lot less.  Currently at rest he has no pain.  He has chronic back pain but no new back pain or flank pain.  Was wondering if he had a kidney stone.     Past Medical History:  Diagnosis Date  . Diverticulitis 05/2013   second episode 2020  . GERD (gastroesophageal reflux disease)   . Hepatic steatosis   . History of colonic polyp - sessile serrated polyp 07/17/2014  . Hypertension   . Serrated polyp of colon     Patient Active Problem List   Diagnosis Date Noted  . Encounter for other preprocedural examination 10/07/2019  . Diverticulitis of colon - recurrent 01/04/2019  . Palpitations 10/31/2016    Past Surgical History:  Procedure Laterality Date  . COLONOSCOPY  2015  . TONSILLECTOMY  1976       Family History  Problem Relation Age of Onset  . Hypertension Mother   . Diabetes Mother   . Stroke Mother   . Hypertension Father   . Diabetes Father   . Colon cancer Neg Hx     Social History   Tobacco Use  . Smoking status: Never Smoker  . Smokeless tobacco: Never Used  Substance Use Topics  . Alcohol use: Yes    Alcohol/week: 6.0 standard drinks    Types: 6 Cans of beer per week    Comment: weekend  . Drug use: No    Home Medications Prior to Admission medications   Medication Sig Start Date End Date Taking? Authorizing Provider  amoxicillin-clavulanate (AUGMENTIN) 875-125 MG  tablet Take 1 tablet by mouth 2 (two) times daily. For diverticulitis attack 11/17/19   Gatha Mayer, MD  aspirin EC 81 MG tablet Take 81 mg by mouth daily.    [provider]  Coenzyme Q10 (CO Q 10 PO) Take 1 tablet by mouth daily.    [provider]  gabapentin (NEURONTIN) 100 MG capsule Take 100 mg by mouth 3 (three) times daily.    [provider]  lisinopril-hydrochlorothiazide (PRINZIDE,ZESTORETIC) 20-12.5 MG per tablet Take 1 tablet by mouth daily.  04/28/13   [provider]  Multiple Vitamin (MULTIVITAMIN WITH MINERALS) TABS Take 1 tablet by mouth daily.    [provider]  Omega-3 Fatty Acids (FISH OIL BURP-LESS PO) Take 1 capsule by mouth 2 (two) times daily.    [provider]  RABEprazole (ACIPHEX) 20 MG tablet Take 20 mg by mouth daily.    [provider]    Allergies    Patient has no known allergies.  Review of Systems   Review of Systems  Constitutional: Negative for fever.  Gastrointestinal: Positive for abdominal pain. Negative for vomiting.  Genitourinary: Positive for difficulty urinating and dysuria. Negative for flank pain, penile pain and testicular pain.  Musculoskeletal: Positive for  back pain (chronic, unchanged).  All other systems reviewed and are negative.   Physical Exam Updated Vital Signs BP (!) 144/88 (BP Location: Left Arm)   Pulse 72   Temp 98.2 F (36.8 C) (Oral)   Resp 18   Ht 5\' 11"  (1.803 m)   Wt 119.7 kg   SpO2 96%   BMI 36.82 kg/m   Physical Exam Vitals and nursing note reviewed.  Constitutional:      Appearance: He is well-developed. He is obese.  HENT:     Head: Normocephalic and atraumatic.     Right Ear: External ear normal.     Left Ear: External ear normal.     Nose: Nose normal.  Eyes:     General:        Right eye: No discharge.        Left eye: No discharge.  Cardiovascular:     Rate and Rhythm: Normal rate and regular rhythm.     Heart sounds: Normal  heart sounds.  Pulmonary:     Effort: Pulmonary effort is normal.     Breath sounds: Normal breath sounds.  Abdominal:     General: There is no distension.     Palpations: Abdomen is soft.     Tenderness: There is no abdominal tenderness.  Genitourinary:    Penis: Circumcised. No tenderness.      Testes:        Right: Tenderness or swelling not present.        Left: Tenderness or swelling not present.  Musculoskeletal:     Cervical back: Neck supple.  Skin:    General: Skin is warm and dry.  Neurological:     Mental Status: He is alert.  Psychiatric:        Mood and Affect: Mood is not anxious.     ED Results / Procedures / Treatments   Labs (all labs ordered are listed, but only abnormal results are displayed) Labs Reviewed  URINALYSIS, ROUTINE W REFLEX MICROSCOPIC    EKG None  Radiology No results found.  Procedures Procedures (including critical care time)  Medications Ordered in ED Medications - No data to display  ED Course  I have reviewed the triage vital signs and the nursing notes.  Pertinent labs & imaging results that were available during my care of the patient were reviewed by me and considered in my medical decision making (see chart for details).    MDM Rules/Calculators/A&P                      Patient's presentation sounds like he was having difficulty urinating after the anesthesia from colonoscopy.  Perhaps he had urinary retention but currently his bladder scan shows 10 mL of urine in bladder.  He has no difficulty urinating now.  He has no blood in his urine or UTI.  My suspicion of a kidney stone with no unilateral pain, flank pain, or pain without urinating is low.  I do not think acute imaging is needed and patient agrees.  At this point will discharge home with supportive care and expectant management and have him follow-up as needed. Final Clinical Impression(s) / ED Diagnoses Final diagnoses:  None    Rx / DC Orders ED Discharge  Orders    None       Sherwood Gambler, MD 11/17/19 [phone]

## 2019-11-17 NOTE — Progress Notes (Signed)
CW- Vitals JB- Temp 

## 2019-11-21 ENCOUNTER — Telehealth: Payer: Self-pay

## 2019-11-21 NOTE — Telephone Encounter (Signed)
  Follow up Call-  Call back number 11/17/2019  Post procedure Call Back phone  # [phone]  Permission to leave phone message Yes  Some recent data might be hidden     Patient questions:  Do you have a fever, pain , or abdominal swelling? No. Pain Score  0 *  Have you tolerated food without any problems? Yes.    Have you been able to return to your normal activities? Yes.    Do you have any questions about your discharge instructions: Diet   No. Medications  No. Follow up visit  No.  Do you have questions or concerns about your Care? No.  Actions: * If pain score is 4 or above: No action needed, pain <4.  1. Have you developed a fever since your procedure? no  2.   Have you had an respiratory symptoms (SOB or cough) since your procedure? no  3.   Have you tested positive for COVID 19 since your procedure no  4.   Have you had any family members/close contacts diagnosed with the COVID 19 since your procedure?  no   If yes to any of these questions please route to Joylene John, RN and Alphonsa Gin, Therapist, sports.

## 2019-12-19 ENCOUNTER — Emergency Department (HOSPITAL_COMMUNITY)
Admission: EM | Admit: 2019-12-19 | Discharge: 2019-12-19 | Disposition: A | Discharge status: Home / Self Care | Payer: 59 | Attending: Emergency Medicine | Admitting: Emergency Medicine

## 2019-12-19 ENCOUNTER — Other Ambulatory Visit: Payer: Self-pay

## 2019-12-19 ENCOUNTER — Encounter (HOSPITAL_COMMUNITY): Payer: Self-pay | Admitting: Emergency Medicine

## 2019-12-19 ENCOUNTER — Emergency Department (HOSPITAL_COMMUNITY): Payer: 59

## 2019-12-19 DIAGNOSIS — R079 Chest pain, unspecified: Secondary | ICD-10-CM

## 2019-12-19 DIAGNOSIS — Z7982 Long term (current) use of aspirin: Secondary | ICD-10-CM | POA: Insufficient documentation

## 2019-12-19 DIAGNOSIS — R0789 Other chest pain: Secondary | ICD-10-CM | POA: Diagnosis not present

## 2019-12-19 DIAGNOSIS — Z79899 Other long term (current) drug therapy: Secondary | ICD-10-CM | POA: Diagnosis not present

## 2019-12-19 DIAGNOSIS — I10 Essential (primary) hypertension: Secondary | ICD-10-CM | POA: Diagnosis not present

## 2019-12-19 LAB — BASIC METABOLIC PANEL
Anion gap: 10 (ref 5–15)
BUN: 17 mg/dL (ref 6–20)
CO2: 28 mmol/L (ref 22–32)
Calcium: 9.8 mg/dL (ref 8.9–10.3)
Chloride: 106 mmol/L (ref 98–111)
Creatinine, Ser: 1.02 mg/dL (ref 0.61–1.24)
GFR calc Af Amer: >60 mL/min (ref >60)
GFR calc non Af Amer: >60 mL/min (ref >60)
Glucose, Bld: 121 mg/dL — ABNORMAL HIGH (ref 70–99)
Potassium: 4.9 mmol/L (ref 3.5–5.1)
Sodium: 144 mmol/L (ref 135–145)

## 2019-12-19 LAB — CBC
HCT: 48.6 % (ref 39.0–52.0)
Hemoglobin: 16.3 g/dL (ref 13.0–17.0)
MCH: 31.3 pg (ref 26.0–34.0)
MCHC: 33.5 g/dL (ref 30.0–36.0)
MCV: 93.3 fL (ref 80.0–100.0)
Platelets: 293 10*3/uL (ref 150–400)
RBC: 5.21 MIL/uL (ref 4.22–5.81)
RDW: 11.9 % (ref 11.5–15.5)
WBC: 7.5 10*3/uL (ref 4.0–10.5)
nRBC: 0 % (ref 0.0–0.2)

## 2019-12-19 LAB — TROPONIN I (HIGH SENSITIVITY)
Troponin I (High Sensitivity): 3 ng/L (ref <18)
Troponin I (High Sensitivity): 5 ng/L (ref <18)

## 2019-12-19 LAB — D-DIMER, QUANTITATIVE: D-Dimer, Quant: 0.32 ug/mL-FEU (ref 0.00–0.50)

## 2019-12-19 MED ORDER — MELOXICAM 7.5 MG PO TABS: 7.5000 mg | ORAL_TABLET | Freq: Every day | ORAL | 0 refills | Status: AC

## 2019-12-19 MED ORDER — ONDANSETRON HCL 4 MG/2ML IJ SOLN
4.0000 mg | Freq: Once | INTRAMUSCULAR | Status: AC
  Administered 2019-12-19: 13:00:00 4 mg via INTRAVENOUS
  Filled 2019-12-19: qty 2

## 2019-12-19 MED ORDER — METHOCARBAMOL 750 MG PO TABS: 750.0000 mg | ORAL_TABLET | Freq: Every evening | ORAL | 0 refills | Status: AC | PRN

## 2019-12-19 MED ORDER — KETOROLAC TROMETHAMINE 30 MG/ML IJ SOLN
30.0000 mg | Freq: Once | INTRAMUSCULAR | Status: AC
  Administered 2019-12-19: 30 mg via INTRAVENOUS
  Filled 2019-12-19: qty 1

## 2019-12-19 NOTE — ED Triage Notes (Signed)
Arrives C/C chest pain x2 days, has had intermittent back  Pains for a few months, yesterday they moved to his chest. Taking antacids without relief. Endorses nausea, denies vomiting.Marland Kitchen

## 2019-12-19 NOTE — ED Provider Notes (Signed)
[facility] DEPT Provider Note   CSN: [id number] Arrival date & time: 12/19/19  1132     History Chief Complaint  Patient presents with  . Chest Pain    Andrew Reyes is a 59 y.o. male.  HPI   Pt is a 59 y/o M with a h/o diverticulitis, GERD, hepatic steatosis, HTN, prediabetes who presents to the ED today for eval of CP that started this morning around 8:00-9:00 AM. Pain located to the left side of the chest. Pain feels sharp in nature and is rated 5-6/10. Sxs have been intermittent since onset and episodes last for about 3-4 seconds at a time. Episodes occur sporadically and are not specifically associated with exertion. Pain seemed to be radiating from his back to the from of his chest. No radiation to the jaw. He also reports that the left lower chest wall feels numb. This has been present for several months and is unchanged today.  He reports associated diaphoresis, pleuritic pain, nausea (states this is chronic). Denies associated SOB, cough, hemoptysis, lightheadedness, dizziness, vomiting, abd pain, fevers.  Denies leg pain/swelling, recent surgery/trauma, recent long travel, personal hx of cancer, or hx of DVT/PE.   He also reports left mid back and radiates to the left side of the rib cage. This sx is chronic in nature and has been present for several months.  He is unsure of the etiology of his symptoms.  Has been extensively worked up by Dr. Carlis Abbott with [facility] orthopedics and Dr. Kathyrn Sheriff his neurosurgeon.  He has had x-rays and an MRI.  He is also seen a Restaurant manager, fast food.  His neurosurgeon started him on an antiviral for possible shingles without a rash and this has not improved his symptoms.  Denies tobacco use. No known early family hx of heart disease.   Past Medical History:  Diagnosis Date  . Diverticulitis 05/2013   second episode 2020  . GERD (gastroesophageal reflux disease)   . Hepatic steatosis   . History of colonic polyp - sessile  serrated polyp 07/17/2014  . Hypertension   . Serrated polyp of colon     Patient Active Problem List   Diagnosis Date Noted  . Encounter for other preprocedural examination 10/07/2019  . Diverticulitis of colon - recurrent 01/04/2019  . Palpitations 10/31/2016    Past Surgical History:  Procedure Laterality Date  . COLONOSCOPY  2015  . TONSILLECTOMY  1976       Family History  Problem Relation Age of Onset  . Hypertension Mother   . Diabetes Mother   . Stroke Mother   . Hypertension Father   . Diabetes Father   . Colon cancer Neg Hx     Social History   Tobacco Use  . Smoking status: Never Smoker  . Smokeless tobacco: Never Used  Substance Use Topics  . Alcohol use: Yes    Alcohol/week: 6.0 standard drinks    Types: 6 Cans of beer per week    Comment: weekend  . Drug use: No    Home Medications Prior to Admission medications   Medication Sig Start Date End Date Taking? Authorizing Provider  aspirin EC 81 MG tablet Take 81 mg by mouth daily.   Yes [provider]  gabapentin (NEURONTIN) 300 MG capsule Take 300 mg by mouth 3 (three) times daily. 12/10/19  Yes [provider]  ibuprofen (ADVIL) 200 MG tablet Take 200-400 mg by mouth every 6 (six) hours as needed for headache or moderate pain.  Yes [provider]  lisinopril-hydrochlorothiazide (PRINZIDE,ZESTORETIC) 20-12.5 MG per tablet Take 1 tablet by mouth daily.  04/28/13  Yes [provider]  metFORMIN (GLUCOPHAGE-XR) 500 MG 24 hr tablet Take 500 mg by mouth at bedtime. 12/01/19  Yes [provider]  Multiple Vitamin (MULTIVITAMIN WITH MINERALS) TABS Take 1 tablet by mouth daily.   Yes [provider]  naproxen sodium (ALEVE) 220 MG tablet Take 220 mg by mouth 2 (two) times daily as needed (headache/pain).   Yes [provider]  Omega-3 Fatty Acids (FISH OIL BURP-LESS PO) Take 1 capsule by mouth daily.    Yes [provider]  RABEprazole  (ACIPHEX) 20 MG tablet Take 20 mg by mouth daily.   Yes [provider]  rosuvastatin (CRESTOR) 10 MG tablet Take 10 mg by mouth daily. 11/26/19  Yes [provider]  valACYclovir (VALTREX) 1000 MG tablet Take 1,000 mg by mouth 3 (three) times daily. 7 day supply 12/12/19  Yes [provider]  amoxicillin-clavulanate (AUGMENTIN) 875-125 MG tablet Take 1 tablet by mouth 2 (two) times daily. For diverticulitis attack Patient not taking: Reported on 12/19/2019 11/17/19   Gatha Mayer, MD  meloxicam (MOBIC) 7.5 MG tablet Take 1 tablet (7.5 mg total) by mouth daily for 7 days. 12/19/19 12/26/19  Dekayla Prestridge S, PA-C  methocarbamol (ROBAXIN) 750 MG tablet Take 1 tablet (750 mg total) by mouth at bedtime as needed for up to 5 days for muscle spasms. 12/19/19 12/24/19  Kaid Seeberger S, PA-C    Allergies    Patient has no known allergies.  Review of Systems   Review of Systems  Constitutional: Negative for chills and fever.  HENT: Negative for ear pain and sore throat.   Eyes: Negative for visual disturbance.  Respiratory: Negative for cough and shortness of breath.   Cardiovascular: Positive for chest pain.  Gastrointestinal: Positive for nausea. Negative for abdominal pain, constipation, diarrhea and vomiting.  Genitourinary: Negative for dysuria and hematuria.  Musculoskeletal: Positive for back pain. Negative for arthralgias.  Skin: Negative for rash.  Neurological: Positive for numbness. Negative for seizures and syncope.  All other systems reviewed and are negative.   Physical Exam Updated Vital Signs BP 129/76   Pulse 61   Temp 97.9 F (36.6 C) (Oral)   Resp 18   Ht 6\' 1"  (1.854 m)   Wt 117.9 kg   SpO2 96%   BMI 34.30 kg/m   Physical Exam Vitals and nursing note reviewed.  Constitutional:      Appearance: He is well-developed.  HENT:     Head: Normocephalic and atraumatic.  Eyes:     Conjunctiva/sclera: Conjunctivae normal.  Cardiovascular:      Rate and Rhythm: Normal rate and regular rhythm.     Heart sounds: No murmur.  Pulmonary:     Effort: Pulmonary effort is normal. No respiratory distress.     Breath sounds: Normal breath sounds. No decreased breath sounds, wheezing, rhonchi or rales.  Chest:     Chest wall: No tenderness.     Comments: Decreased sensation to the left chest wall (chronic). No rash to the chest. Abdominal:     Palpations: Abdomen is soft.     Tenderness: There is no abdominal tenderness. There is no guarding or rebound.  Musculoskeletal:     Cervical back: Neck supple.     Right lower leg: No tenderness. No edema.     Left lower leg: No tenderness. No edema.  Skin:  General: Skin is warm and dry.  Neurological:     Mental Status: He is alert.     ED Results / Procedures / Treatments   Labs (all labs ordered are listed, but only abnormal results are displayed) Labs Reviewed  BASIC METABOLIC PANEL - Abnormal; Notable for the following components:      Result Value   Glucose, Bld 121 (*)    All other components within normal limits  CBC  D-DIMER, QUANTITATIVE (NOT AT [facility])  TROPONIN I (HIGH SENSITIVITY)  TROPONIN I (HIGH SENSITIVITY)    EKG EKG Interpretation  Date/Time:  Monday December 19 2019 11:49:04 EDT Ventricular Rate:  74 PR Interval:    QRS Duration: 88 QT Interval:  379 QTC Calculation: 421 R Axis:   46 Text Interpretation: Sinus rhythm Baseline wander in lead(s) II III aVF since last tracing no significant change Confirmed by Daleen Bo [phone]) on 12/19/2019 1:44:31 PM   Radiology DG Chest 2 View  Result Date: 12/19/2019 CLINICAL DATA:  Chest and posterior left subscapular pain for 9 months. EXAM: CHEST - 2 VIEW COMPARISON:  None. FINDINGS: Lungs clear. Heart size normal. No pneumothorax or pleural fluid. No acute or focal bony abnormality. IMPRESSION: Normal chest. Electronically Signed   By: Inge Rise M.D.   On: 12/19/2019 13:13   DG Ribs Unilateral  Left  Result Date: 12/19/2019 CLINICAL DATA:  Chest pain EXAM: LEFT RIBS - 2 VIEW COMPARISON:  Chest radiograph Feb 14, 2004 FINDINGS: Frontal and oblique views obtained of left ribs. Left lung is clear. Heart size and left-sided pulmonary vascularity appear normal. No pneumothorax or pleural effusion on the left. No evident left-sided rib fracture. IMPRESSION: No left-sided rib fracture.  Left lung clear. Electronically Signed   By: Lowella Grip III M.D.   On: 12/19/2019 13:12    Procedures Procedures (including critical care time)  Medications Ordered in ED Medications  ketorolac (TORADOL) 30 MG/ML injection 30 mg (30 mg Intravenous Given 12/19/19 1317)  ondansetron (ZOFRAN) injection 4 mg (4 mg Intravenous Given 12/19/19 1317)    ED Course  I have reviewed the triage vital signs and the nursing notes.  Pertinent labs & imaging results that were available during my care of the patient were reviewed by me and considered in my medical decision making (see chart for details).    MDM Rules/Calculators/A&P                      59 year old male presenting for evaluation of left-sided chest pain that started this morning and has been intermittent since onset.  Symptoms sound atypical and last for seconds at a time.  They are not associated with exertion or other typical symptoms.  He is obese and has history of hypertension and prediabetes.  He has no history of hyperlipidemia.  No early family history of heart disease.  Heart score is 3 making him low risk for ACS.  Reviewed/interpreted lab CBC nonacute BMP nonacute Delta troponins are negative. D-dimer is negative making PE less likely  EKG with baseline wander leads in 2 3 and aVF but no significant change since last tracing and no ischemic changes.  Chest x-ray with left ribs.  Reviewed/interpreted -no pneumonia, pneumothorax, widened mediastinum or other acute abnormality at this time.  No evidence of left rib  fracture.  Reassessed patient.  He felt improved after Toradol.  He is in no acute distress.  His work-up today is reassuring.  At this time patient has no  evidence of emergent etiology of symptoms.  Discussed the possibility that this could be related to pleurisy.  Further so changing to a different anti-inflammatory medication.  Discussed possibility of MSK cause and also give Rx for Robaxin.  Advised him to follow-up with his PCP in regards to symptoms and return to the ED for new or worsening symptoms.  He voiced understanding plan reasons to return.  All questions answered.  Patient stable for discharge.  Final Clinical Impression(s) / ED Diagnoses Final diagnoses:  Atypical chest pain    Rx / DC Orders ED Discharge Orders         Ordered    meloxicam (MOBIC) 7.5 MG tablet  Daily     12/19/19 1629    methocarbamol (ROBAXIN) 750 MG tablet  At bedtime PRN     12/19/19 1629           Normagene Harvie S, PA-C 12/19/19 1629    Daleen Bo, MD 12/19/19 1718

## 2019-12-19 NOTE — Discharge Instructions (Addendum)
Do not take Mobic with other antiinflammatories such as Motrin or Naproxen.  You were given a prescription for Robaxin which is a muscle relaxer.  You should not drive, work, or operate machinery while taking this medication as it can make you very drowsy.    Please follow up with your primary care provider within 5-7 days for re-evaluation of your symptoms. If you do not have a primary care provider, information for a healthcare clinic has been provided for you to make arrangements for follow up care. Please return to the emergency department for any new or worsening symptoms.

## 2020-12-04 ENCOUNTER — Emergency Department (HOSPITAL_BASED_OUTPATIENT_CLINIC_OR_DEPARTMENT_OTHER)
Admission: EM | Admit: 2020-12-04 | Discharge: 2020-12-04 | Disposition: A | Discharge status: Home / Self Care | Payer: No Typology Code available for payment source | Attending: Emergency Medicine | Admitting: Emergency Medicine

## 2020-12-04 ENCOUNTER — Encounter (HOSPITAL_BASED_OUTPATIENT_CLINIC_OR_DEPARTMENT_OTHER): Payer: Self-pay

## 2020-12-04 ENCOUNTER — Emergency Department (HOSPITAL_BASED_OUTPATIENT_CLINIC_OR_DEPARTMENT_OTHER): Payer: No Typology Code available for payment source

## 2020-12-04 ENCOUNTER — Other Ambulatory Visit: Payer: Self-pay

## 2020-12-04 DIAGNOSIS — N1330 Unspecified hydronephrosis: Secondary | ICD-10-CM | POA: Insufficient documentation

## 2020-12-04 DIAGNOSIS — Z7982 Long term (current) use of aspirin: Secondary | ICD-10-CM | POA: Diagnosis not present

## 2020-12-04 DIAGNOSIS — Z7984 Long term (current) use of oral hypoglycemic drugs: Secondary | ICD-10-CM | POA: Diagnosis not present

## 2020-12-04 DIAGNOSIS — R109 Unspecified abdominal pain: Secondary | ICD-10-CM

## 2020-12-04 DIAGNOSIS — Z79899 Other long term (current) drug therapy: Secondary | ICD-10-CM | POA: Insufficient documentation

## 2020-12-04 DIAGNOSIS — K219 Gastro-esophageal reflux disease without esophagitis: Secondary | ICD-10-CM | POA: Diagnosis not present

## 2020-12-04 DIAGNOSIS — I10 Essential (primary) hypertension: Secondary | ICD-10-CM | POA: Diagnosis not present

## 2020-12-04 LAB — COMPREHENSIVE METABOLIC PANEL
ALT: 54 U/L — ABNORMAL HIGH (ref 0–44)
AST: 40 U/L (ref 15–41)
Albumin: 4.5 g/dL (ref 3.5–5.0)
Alkaline Phosphatase: 58 U/L (ref 38–126)
Anion gap: 10 (ref 5–15)
BUN: 19 mg/dL (ref 6–20)
CO2: 24 mmol/L (ref 22–32)
Calcium: 9.6 mg/dL (ref 8.9–10.3)
Chloride: 104 mmol/L (ref 98–111)
Creatinine, Ser: 0.87 mg/dL (ref 0.61–1.24)
GFR, Estimated: >60 mL/min (ref >60)
Glucose, Bld: 103 mg/dL — ABNORMAL HIGH (ref 70–99)
Potassium: 3.8 mmol/L (ref 3.5–5.1)
Sodium: 138 mmol/L (ref 135–145)
Total Bilirubin: 0.8 mg/dL (ref 0.3–1.2)
Total Protein: 7.5 g/dL (ref 6.5–8.1)

## 2020-12-04 LAB — CBC WITH DIFFERENTIAL/PLATELET
Abs Immature Granulocytes: 0.02 10*3/uL (ref 0.00–0.07)
Basophils Absolute: 0.1 10*3/uL (ref 0.0–0.1)
Basophils Relative: 1 %
Eosinophils Absolute: 0.3 10*3/uL (ref 0.0–0.5)
Eosinophils Relative: 3 %
HCT: 43.8 % (ref 39.0–52.0)
Hemoglobin: 15.2 g/dL (ref 13.0–17.0)
Immature Granulocytes: 0 %
Lymphocytes Relative: 32 %
Lymphs Abs: 3 10*3/uL (ref 0.7–4.0)
MCH: 31.3 pg (ref 26.0–34.0)
MCHC: 34.7 g/dL (ref 30.0–36.0)
MCV: 90.3 fL (ref 80.0–100.0)
Monocytes Absolute: 1 10*3/uL (ref 0.1–1.0)
Monocytes Relative: 11 %
Neutro Abs: 5 10*3/uL (ref 1.7–7.7)
Neutrophils Relative %: 53 %
Platelets: 269 10*3/uL (ref 150–400)
RBC: 4.85 MIL/uL (ref 4.22–5.81)
RDW: 12.1 % (ref 11.5–15.5)
WBC: 9.3 10*3/uL (ref 4.0–10.5)
nRBC: 0 % (ref 0.0–0.2)

## 2020-12-04 LAB — URINALYSIS, ROUTINE W REFLEX MICROSCOPIC
Bilirubin Urine: NEGATIVE
Glucose, UA: NEGATIVE mg/dL
Hgb urine dipstick: NEGATIVE
Ketones, ur: NEGATIVE mg/dL
Leukocytes,Ua: NEGATIVE
Nitrite: NEGATIVE
Protein, ur: NEGATIVE mg/dL
Specific Gravity, Urine: 1.01 (ref 1.005–1.030)
pH: 7 (ref 5.0–8.0)

## 2020-12-04 MED ORDER — ONDANSETRON HCL 4 MG/2ML IJ SOLN
4.0000 mg | Freq: Once | INTRAMUSCULAR | Status: AC
  Administered 2020-12-04: 4 mg via INTRAVENOUS
  Filled 2020-12-04: qty 2

## 2020-12-04 MED ORDER — IOHEXOL 300 MG/ML  SOLN
100.0000 mL | Freq: Once | INTRAMUSCULAR | Status: AC | PRN
  Administered 2020-12-04: 100 mL via INTRAVENOUS

## 2020-12-04 MED ORDER — MORPHINE SULFATE (PF) 4 MG/ML IV SOLN
4.0000 mg | Freq: Once | INTRAVENOUS | Status: AC
  Administered 2020-12-04: 4 mg via INTRAVENOUS
  Filled 2020-12-04: qty 1

## 2020-12-04 MED ORDER — SODIUM CHLORIDE 0.9 % IV BOLUS
1000.0000 mL | Freq: Once | INTRAVENOUS | Status: AC
  Administered 2020-12-04: 1000 mL via INTRAVENOUS

## 2020-12-04 MED ORDER — HYDROCODONE-ACETAMINOPHEN 5-325 MG PO TABS: 1.0000 | ORAL_TABLET | ORAL | 0 refills | Status: DC | PRN

## 2020-12-04 NOTE — Discharge Instructions (Addendum)
Your CT scan showed hydronephrosis.  It is unclear what is causing this.  Given you have had this pain for duration of time I do recommend following up with urology.  I have placed the contact information of wanting your discharge paperwork.  Please call to schedule an appointment.  I have written you for a short course of pain medication.  Do not drive or operate heavy machinery.  This medication may become addictive.  Return to the ED for any new or worsening symptoms.

## 2020-12-04 NOTE — ED Provider Notes (Signed)
[facility] EMERGENCY DEPARTMENT Provider Note   CSN: [id number] Arrival date & time: 12/04/20  1820     History Chief Complaint  Patient presents with  . Flank Pain    Andrew Reyes is a 60 y.o. male with past medical history significant for diverticulitis, GERD, hepatic steatosis, hypertension who presents for evaluation of flank pain.  Has had flank pain x2 months.  Has intermittently been worsening.  Increasing over the last week.  Will radiate into her right lower quadrant pain occurs.  Has noted some urinary frequency without dysuria.  Denies any hematuria.  No prior history of kidney stones.  Denies prior history of dissection or AAA.  Denies fever, chills, chest pain, shortness of breath, emesis, diarrhea, constipation, paresthesias, weakness.  Patient states the pain was significantly worse since he does become nauseous.  Took Aleve over the weekend.  Has not taken anything for pain.  His pain improves when he lays flat, worse with movement.  Denies additional aggravating or alleviating factors. Laying flat rates pain a 2/10 with movement a 8/10.  Denies any testicular pain, redness, swelling or warmth.  History obtained from patient and past medical records.  No interpreter used.  HPI     Past Medical History:  Diagnosis Date  . Diverticulitis 05/2013   second episode 2020  . GERD (gastroesophageal reflux disease)   . Hepatic steatosis   . History of colonic polyp - sessile serrated polyp 07/17/2014  . Hypertension   . Serrated polyp of colon     Patient Active Problem List   Diagnosis Date Noted  . Encounter for other preprocedural examination 10/07/2019  . Diverticulitis of colon - recurrent 01/04/2019  . Palpitations 10/31/2016    Past Surgical History:  Procedure Laterality Date  . COLONOSCOPY  2015  . TONSILLECTOMY  1976       Family History  Problem Relation Age of Onset  . Hypertension Mother   . Diabetes Mother   . Stroke Mother   .  Hypertension Father   . Diabetes Father   . Colon cancer Neg Hx     Social History   Tobacco Use  . Smoking status: Never Smoker  . Smokeless tobacco: Never Used  Vaping Use  . Vaping Use: Never used  Substance Use Topics  . Alcohol use: Yes    Alcohol/week: 6.0 standard drinks    Types: 6 Cans of beer per week    Comment: weekend  . Drug use: No    Home Medications Prior to Admission medications   Medication Sig Start Date End Date Taking? Authorizing Provider  aspirin EC 81 MG tablet Take 81 mg by mouth daily.   Yes [provider]  gabapentin (NEURONTIN) 300 MG capsule Take 300 mg by mouth 3 (three) times daily. 12/10/19  Yes [provider]  HYDROcodone-acetaminophen (NORCO/VICODIN) 5-325 MG tablet Take 1 tablet by mouth every 4 (four) hours as needed. 12/04/20  Yes Delani Kohli A, PA-C  lisinopril-hydrochlorothiazide (PRINZIDE,ZESTORETIC) 20-12.5 MG per tablet Take 1 tablet by mouth daily.  04/28/13  Yes [provider]  metFORMIN (GLUCOPHAGE-XR) 500 MG 24 hr tablet Take 500 mg by mouth at bedtime. 12/01/19  Yes [provider]  Multiple Vitamin (MULTIVITAMIN WITH MINERALS) TABS Take 1 tablet by mouth daily.   Yes [provider]  Omega-3 Fatty Acids (FISH OIL BURP-LESS PO) Take 1 capsule by mouth daily.    Yes [provider]  RABEprazole (ACIPHEX) 20 MG tablet Take 20 mg  by mouth daily.   Yes [provider]  rosuvastatin (CRESTOR) 10 MG tablet Take 10 mg by mouth daily. 11/26/19  Yes [provider]  amoxicillin-clavulanate (AUGMENTIN) 875-125 MG tablet Take 1 tablet by mouth 2 (two) times daily. For diverticulitis attack Patient not taking: No sig reported 11/17/19   Gatha Mayer, MD  ibuprofen (ADVIL) 200 MG tablet Take 200-400 mg by mouth every 6 (six) hours as needed for headache or moderate pain.    [provider]  naproxen sodium (ALEVE) 220 MG tablet Take 220 mg by mouth 2 (two) times  daily as needed (headache/pain).    [provider]  valACYclovir (VALTREX) 1000 MG tablet Take 1,000 mg by mouth 3 (three) times daily. 7 day supply 12/12/19   [provider]    Allergies    Patient has no known allergies.  Review of Systems   Review of Systems  Constitutional: Negative.   HENT: Negative.   Respiratory: Negative.   Cardiovascular: Negative.   Gastrointestinal: Positive for abdominal pain and nausea. Negative for abdominal distention, anal bleeding, blood in stool, constipation, diarrhea, rectal pain and vomiting.  Genitourinary: Positive for flank pain and frequency.  Skin: Negative.   Neurological: Negative.   All other systems reviewed and are negative.   Physical Exam Updated Vital Signs BP 128/74 (BP Location: Right Arm)   Pulse 67   Temp 97.9 F (36.6 C) (Oral)   Resp 20   Ht 6\' 1"  (1.854 m)   Wt 114.8 kg   SpO2 97%   BMI 33.38 kg/m   Physical Exam Vitals and nursing note reviewed.  Constitutional:      General: He is not in acute distress.    Appearance: He is well-developed. He is not ill-appearing, toxic-appearing or diaphoretic.  HENT:     Head: Normocephalic and atraumatic.     Nose: Nose normal.     Mouth/Throat:     Mouth: Mucous membranes are moist.  Eyes:     Pupils: Pupils are equal, round, and reactive to light.  Cardiovascular:     Rate and Rhythm: Normal rate and regular rhythm.     Pulses: Normal pulses.          Radial pulses are 2+ on the right side and 2+ on the left side.       Dorsalis pedis pulses are 2+ on the right side and 2+ on the left side.     Heart sounds: Normal heart sounds.  Pulmonary:     Effort: Pulmonary effort is normal. No respiratory distress.     Breath sounds: Normal breath sounds.     Comments: Speaks in full sentences without difficulty Abdominal:     General: Bowel sounds are normal. There is no distension.     Palpations: Abdomen is soft.     Tenderness: There is abdominal  tenderness. There is right CVA tenderness. There is no left CVA tenderness or guarding.     Comments: Minimal tenderness to right lower quadrant.  Positive CVA tap on right.  No obvious hernias  Musculoskeletal:        General: No swelling, tenderness, deformity or signs of injury. Normal range of motion.     Cervical back: Normal range of motion and neck supple.     Right lower leg: No edema.     Left lower leg: No edema.     Comments: No midline spinal tenderness.  Moves all 4 extremities at difficulty.  Compartments soft.  Skin:    General: Skin is warm and dry.     Capillary Refill: Capillary refill takes less than 2 seconds.     Comments: No edema, erythema or warmth.  No fluctuance or induration.  Neurological:     General: No focal deficit present.     Mental Status: He is alert and oriented to person, place, and time.     Comments: Cranial 2-12 intact Ambulatory without difficulty Intact sensation    ED Results / Procedures / Treatments   Labs (all labs ordered are listed, but only abnormal results are displayed) Labs Reviewed  COMPREHENSIVE METABOLIC PANEL - Abnormal; Notable for the following components:      Result Value   Glucose, Bld 103 (*)    ALT 54 (*)    All other components within normal limits  URINE CULTURE  URINALYSIS, ROUTINE W REFLEX MICROSCOPIC  CBC WITH DIFFERENTIAL/PLATELET    EKG None  Radiology CT Abdomen Pelvis W Contrast  Result Date: 12/04/2020 CLINICAL DATA:  Right lower quadrant pain. EXAM: CT ABDOMEN AND PELVIS WITH CONTRAST TECHNIQUE: Multidetector CT imaging of the abdomen and pelvis was performed using the standard protocol following bolus administration of intravenous contrast. CONTRAST:  133mL OMNIPAQUE IOHEXOL 300 MG/ML  SOLN COMPARISON:  June 05, 2018 FINDINGS: Lower chest: No acute abnormality. Hepatobiliary: There is mild fatty infiltration of the liver parenchyma. No focal liver abnormality is seen. No gallstones, gallbladder  wall thickening, or biliary dilatation. Pancreas: Unremarkable. No pancreatic ductal dilatation or surrounding inflammatory changes. Spleen: Normal in size without focal abnormality. Adrenals/Urinary Tract: Adrenal glands are unremarkable. Kidneys are normal, without focal lesions. Mild right-sided hydronephrosis is seen without evidence of renal calculi. Bladder is unremarkable. Stomach/Bowel: Stomach is within normal limits. The appendix is not identified. No evidence of bowel wall thickening, distention, or inflammatory changes. Noninflamed diverticula are seen throughout the sigmoid colon. Vascular/Lymphatic: Aortic atherosclerosis. No enlarged abdominal or pelvic lymph nodes. Reproductive: The prostate gland is mildly enlarged. Other: No abdominal wall hernia or abnormality. No abdominopelvic ascites. Musculoskeletal: No acute or significant osseous findings. IMPRESSION: 1. Mild right-sided hydronephrosis without evidence of renal calculi. 2. Fatty liver. 3. Sigmoid diverticulosis. 4. Aortic atherosclerosis. Aortic Atherosclerosis (ICD10-I70.0). Electronically Signed   By: Virgina Norfolk M.D.   On: 12/04/2020 20:24    Procedures Procedures   Medications Ordered in ED Medications  ondansetron (ZOFRAN) injection 4 mg (4 mg Intravenous Given 12/04/20 1859)  sodium chloride 0.9 % bolus 1,000 mL (1,000 mLs Intravenous New Bag/Given 12/04/20 1901)  morphine 4 MG/ML injection 4 mg (4 mg Intravenous Given 12/04/20 1859)  iohexol (OMNIPAQUE) 300 MG/ML solution 100 mL (100 mLs Intravenous Contrast Given 12/04/20 2015)    ED Course  I have reviewed the triage vital signs and the nursing notes.  Pertinent labs & imaging results that were available during my care of the patient were reviewed by me and considered in my medical decision making (see chart for details).  60 year old here for evaluation of right flank pain.  Seems to be ongoing for some time however worsening recently.  States pain is  intermittent in nature.  Now having some urinary frequency without hematuria or dysuria.  He has positive CVA tap on the right.  Does occasionally have some right lower quadrant abdominal pain associated nausea.  He is passing flatus and having bowel movements.  He is neurovascularly intact.  Does have history of hypertension.  Denies any chest pain, shortness of breath, no midline pulsatile abdominal mass, neurovascularly intact.  Denies prior history of kidney stones, AAA or dissections.  Plan on labs, imaging and reassess  Labs and imaging personally reviewed and interpreted:  CBC without leukocytosis CMP glucose 103, ALT 54, no additional electrolyte, renal or liver normality UA negative for infection.  No materia CT AP with hydronephrosis however no clear etiology, no stone, infectious process  Patient reassessed.  Pain improved.  He has a negative Murphy sign.  Low suspicion for acute cholecystitis.  Patient without midline pulsatile abdominal mass, neurovascularly intact.  Imaging reassuring.  Low suspicion for AAA, dissection.  No evidence of appendicitis or infectious process on exam.  Does have hydronephrosis without evidence of stone or unclear etiology.  Given patient has had symptoms for months we will have him follow-up with urology.  He is agreeable for this.  Patient is nontoxic, nonseptic appearing, in no apparent distress.  Patient's pain and other symptoms adequately managed in emergency department.  Fluid bolus given.  Labs, imaging and vitals reviewed.  Patient does not meet the SIRS or Sepsis criteria.  On repeat exam patient does not have a surgical abdomin and there are no peritoneal signs.  No indication of appendicitis, bowel obstruction, bowel perforation, cholecystitis, diverticulitis, AAA, dissection, torsion, atypical ACS, PE.    The patient has been appropriately medically screened and/or stabilized in the ED. I have low suspicion for any other emergent medical condition  which would require further screening, evaluation or treatment in the ED or require inpatient management.  Patient is hemodynamically stable and in no acute distress.  Patient able to ambulate in department prior to ED.  Evaluation does not show acute pathology that would require ongoing or additional emergent interventions while in the emergency department or further inpatient treatment.  I have discussed the diagnosis with the patient and answered all questions.  Pain is been managed while in the emergency department and patient has no further complaints prior to discharge.  Patient is comfortable with plan discussed in room and is stable for discharge at this time.  I have discussed strict return precautions for returning to the emergency department.  Patient was encouraged to follow-up with PCP/specialist refer to at discharge.      MDM Rules/Calculators/A&P                           Final Clinical Impression(s) / ED Diagnoses Final diagnoses:  Flank pain  Hydronephrosis, unspecified hydronephrosis type    Rx / DC Orders ED Discharge Orders         Ordered    HYDROcodone-acetaminophen (NORCO/VICODIN) 5-325 MG tablet  Every 4 hours PRN        12/04/20 2106           Holley Wirt A, PA-C 12/04/20 2110    Lennice Sites, DO 12/04/20 2312

## 2020-12-04 NOTE — ED Triage Notes (Signed)
Pt arrives with right flank pain for several months. States has been worsening. Denies urinary symptoms. States if pain is bad enough itll wrap around to right lower abdomen.

## 2020-12-06 ENCOUNTER — Telehealth: Payer: Self-pay | Admitting: Internal Medicine

## 2020-12-06 ENCOUNTER — Other Ambulatory Visit (INDEPENDENT_AMBULATORY_CARE_PROVIDER_SITE_OTHER): Payer: No Typology Code available for payment source

## 2020-12-06 DIAGNOSIS — K922 Gastrointestinal hemorrhage, unspecified: Secondary | ICD-10-CM

## 2020-12-06 LAB — URINE CULTURE: Culture: NO GROWTH

## 2020-12-06 LAB — CBC
HCT: 44.1 % (ref 39.0–52.0)
Hemoglobin: 15.1 g/dL (ref 13.0–17.0)
MCHC: 34.1 g/dL (ref 30.0–36.0)
MCV: 90.6 fl (ref 78.0–100.0)
Platelets: 267 10*3/uL (ref 150.0–400.0)
RBC: 4.87 Mil/uL (ref 4.22–5.81)
RDW: 12.8 % (ref 11.5–15.5)
WBC: 12 10*3/uL — ABNORMAL HIGH (ref 4.0–10.5)

## 2020-12-06 LAB — BASIC METABOLIC PANEL
BUN: 16 mg/dL (ref 6–23)
CO2: 28 mEq/L (ref 19–32)
Calcium: 9.4 mg/dL (ref 8.4–10.5)
Chloride: 105 mEq/L (ref 96–112)
Creatinine, Ser: 0.82 mg/dL (ref 0.40–1.50)
GFR: 96 mL/min (ref >60.00)
Glucose, Bld: 101 mg/dL — ABNORMAL HIGH (ref 70–99)
Potassium: 4 mEq/L (ref 3.5–5.1)
Sodium: 141 mEq/L (ref 135–145)

## 2020-12-06 NOTE — Telephone Encounter (Signed)
Patient notified Follow up arranged for tomorrow at 8:30 He will come for labs now. He understands to go to the ED for additional rectal bleeding

## 2020-12-06 NOTE — Telephone Encounter (Signed)
Patients wife called states the patient thinks he is having a diverticulitis flare up and he is also having rectal bleeding they are seeking advise.

## 2020-12-06 NOTE — Telephone Encounter (Signed)
Left message for patient to call back  

## 2020-12-06 NOTE — Telephone Encounter (Signed)
Since pain is resolved this does not sound like diverticulitis.  It could have been diverticular hemorrhage but if the bleeding has stopped I suggest I see him at 830 tomorrow I have that open and I have an afternoon appointment open.  Should he have recurrent problems he will need to go to the emergency department in the interim.   Please have him come to have a complete blood count and a BMET today and text me when you see results

## 2020-12-06 NOTE — Telephone Encounter (Signed)
Patient was evaluated in the ED for hydronephrosis and abdominal pain on 3/15. Has been given injections for pain and sent home with hydrocodone. He was seen by [facility] yesterday and an additional scan has been ordered for next week to further evaluate his hydronephrosis and kidney issues (he is not able to articulate what he was told yesterday).  The pain meds have caused constipation.  Yesterday after eating a large meal "got sick"  8-10 bloody bowel movements "it was a lot of blood".  He is worried this is diverticulitis.  No bleeding since early this am and the blood was "around the stool".  No longer having LLQ pain. No APP openings today or tomorrow.  Dr. Carlean Purl please advise.

## 2020-12-07 ENCOUNTER — Other Ambulatory Visit (HOSPITAL_COMMUNITY): Payer: Self-pay | Admitting: Urology

## 2020-12-07 ENCOUNTER — Ambulatory Visit (INDEPENDENT_AMBULATORY_CARE_PROVIDER_SITE_OTHER): Payer: No Typology Code available for payment source | Admitting: Internal Medicine

## 2020-12-07 ENCOUNTER — Other Ambulatory Visit: Payer: Self-pay | Admitting: Urology

## 2020-12-07 ENCOUNTER — Encounter: Payer: Self-pay | Admitting: Internal Medicine

## 2020-12-07 ENCOUNTER — Other Ambulatory Visit: Payer: Self-pay

## 2020-12-07 VITALS — BP 136/70 | HR 68 | Ht 71.0 in | Wt 260.2 lb

## 2020-12-07 DIAGNOSIS — R10814 Left lower quadrant abdominal tenderness: Secondary | ICD-10-CM

## 2020-12-07 DIAGNOSIS — K921 Melena: Secondary | ICD-10-CM

## 2020-12-07 DIAGNOSIS — K641 Second degree hemorrhoids: Secondary | ICD-10-CM

## 2020-12-07 DIAGNOSIS — K5732 Diverticulitis of large intestine without perforation or abscess without bleeding: Secondary | ICD-10-CM | POA: Diagnosis not present

## 2020-12-07 DIAGNOSIS — R109 Unspecified abdominal pain: Secondary | ICD-10-CM

## 2020-12-07 DIAGNOSIS — N130 Hydronephrosis with ureteropelvic junction obstruction: Secondary | ICD-10-CM

## 2020-12-07 MED ORDER — AMOXICILLIN-POT CLAVULANATE 875-125 MG PO TABS: 1.0000 | ORAL_TABLET | Freq: Two times a day (BID) | ORAL | 0 refills | Status: DC

## 2020-12-07 NOTE — Progress Notes (Signed)
Andrew Reyes 60 y.o. 30-Jun-1961 [id number]  Assessment & Plan:   Encounter Diagnoses  Name Primary?  . Hematochezia Yes  . Prolapsed internal hemorrhoids, grade 2   . Left lower quadrant abdominal tenderness without rebound tenderness   . Diverticulitis of colon - recurrent     The bleeding has stopped.  It could have been hemorrhoidal a could have been a self-limited diverticular bleed.  He had a colonoscopy last year I do not think we need to repeat that now the bleeding has stopped.  He has some left lower quadrant tenderness and a history of recurrent diverticulitis.  Neither he nor I think he is having a flare.  I refilled his Augmentin generic to have in case it does.  He will let me know if he has to take it.  Follow-up with urology regarding hydronephrosis we will review those records.  I appreciate the opportunity to care for this patient. CC: Pa, [facility] Dr. Link Snuffer [facility]       Subjective:   Chief Complaint: Blood in the stool/rectal bleeding  HPI Andrew Reyes is a 60 year old white man with a history of colon polyps and diverticulitis problems, last colonoscopy 11/17/2019 with diverticulosis in the left colon.  He has been having problems with right-sided pain he went to the emergency department and was found to have hydronephrosis.  I have reviewed that CT scan.  Kidney function was normal.  He had a CT scan with IV but no oral contrast.  This was on 12/04/2020.  He saw urology.  They found a prostate nodule and plan a biopsy at some sort of  other kidney test.  The day before yesterday he had been constipated he had some left lower quadrant pain and then had a normal brown bowel movement that was formed and then it was very loose and then subsequent stools became bloody and he was dripping blood and wiping blood as well.  When he called [place] yesterday that had resolved other than a slight bit of blood on the toilet paper.  I had him check a CBC  and a BMP T and these were normal.  He is here today and says he feels better though he continues to have this right flank pain from his hydronephrosis.    No Known Allergies Current Meds  Medication Sig  . aspirin EC 81 MG tablet Take 81 mg by mouth daily.  Marland Kitchen gabapentin (NEURONTIN) 300 MG capsule Take 300 mg by mouth 2 (two) times daily.  Marland Kitchen ibuprofen (ADVIL) 200 MG tablet Take 200-400 mg by mouth every 6 (six) hours as needed for headache or moderate pain.  Marland Kitchen lisinopril (ZESTRIL) 10 MG tablet Take 10 mg by mouth daily.  . meloxicam (MOBIC) 15 MG tablet Take 15 mg by mouth daily.  . metFORMIN (GLUCOPHAGE-XR) 500 MG 24 hr tablet Take 500 mg by mouth at bedtime.  . Multiple Vitamin (MULTIVITAMIN WITH MINERALS) TABS Take 1 tablet by mouth daily.  . naproxen sodium (ALEVE) 220 MG tablet Take 220 mg by mouth 2 (two) times daily as needed (headache/pain).  . Omega-3 Fatty Acids (FISH OIL BURP-LESS PO) Take 1 capsule by mouth daily.   . RABEprazole (ACIPHEX) 20 MG tablet Take 20 mg by mouth daily.   Past Medical History:  Diagnosis Date  . Diverticulitis 05/2013   second episode 2020  . GERD (gastroesophageal reflux disease)   . Hepatic steatosis   . History of colonic polyp - sessile serrated polyp  07/17/2014  . Hypertension   . Serrated polyp of colon    Past Surgical History:  Procedure Laterality Date  . COLONOSCOPY  2015  . TONSILLECTOMY  1976   Social History   Social History Narrative   Married   Museum/gallery conservator   Some EtOH weekend beer    Never smoker/drugs   family history includes Diabetes in his father and mother; Hypertension in his father and mother; Stroke in his mother.   Review of Systems See above  Objective:   Physical Exam BP 136/70 (BP Location: Left Arm, Patient Position: Sitting, Cuff Size: Normal)   Pulse 68   Ht 5\' 11"  (1.803 m) Comment: height measured without shoes  Wt 260 lb 4 oz (118 kg)   BMI 36.30 kg/m  Well-developed overweight-obese  white man in mild pain he is uncomfortable when he moves  There is right CVA tenderness  The abdomen is soft with some right lower quadrant more so than left lower quadrant tenderness.  There is no mass.  There is no rebound but slight guarding.   Rectal examination is slightly tender but not like a fissure no mass no blood.  Anoderm looks normal.  Anoscopy is performed which shows grade 2 inflamed hemorrhoids left lateral right posterior most prominent.

## 2020-12-07 NOTE — Patient Instructions (Signed)
Please follow up with Dr Carlean Purl as needed. If you have more bleeding call us.  We have sent the following medications to your pharmacy for you to pick up at your convenience: Augmentin to have on hand, if you use it call us  We will get your records from [facility] for review.   I appreciate the opportunity to care for you. Silvano Rusk, MD, [facility]

## 2020-12-20 ENCOUNTER — Other Ambulatory Visit: Payer: Self-pay

## 2020-12-20 ENCOUNTER — Ambulatory Visit (HOSPITAL_COMMUNITY)
Admission: RE | Admit: 2020-12-20 | Discharge: 2020-12-20 | Disposition: A | Discharge status: Home / Self Care | Payer: No Typology Code available for payment source | Source: Ambulatory Visit | Attending: Urology | Admitting: Urology

## 2020-12-20 DIAGNOSIS — R109 Unspecified abdominal pain: Secondary | ICD-10-CM | POA: Diagnosis present

## 2020-12-20 DIAGNOSIS — N130 Hydronephrosis with ureteropelvic junction obstruction: Secondary | ICD-10-CM

## 2020-12-20 DIAGNOSIS — R10A0 Flank pain, unspecified side: Secondary | ICD-10-CM

## 2020-12-20 MED ORDER — FUROSEMIDE 10 MG/ML IJ SOLN
INTRAMUSCULAR | Status: AC
  Administered 2020-12-20: 59 mg via INTRAVENOUS
  Filled 2020-12-20: qty 8

## 2020-12-20 MED ORDER — TECHNETIUM TC 99M MERTIATIDE
4.6000 | Freq: Once | INTRAVENOUS | Status: AC
  Administered 2020-12-20: 4.6 via INTRAVENOUS

## 2020-12-20 MED ORDER — FUROSEMIDE 10 MG/ML IJ SOLN: 59.0000 mg | Freq: Once | INTRAMUSCULAR | Status: AC

## 2021-07-13 ENCOUNTER — Telehealth: Payer: Self-pay | Admitting: Nurse Practitioner

## 2021-07-13 ENCOUNTER — Other Ambulatory Visit: Payer: Self-pay | Admitting: Nurse Practitioner

## 2021-07-13 MED ORDER — AMOXICILLIN-POT CLAVULANATE 875-125 MG PO TABS: 1.0000 | ORAL_TABLET | Freq: Two times a day (BID) | ORAL | 0 refills | Status: DC

## 2021-07-13 NOTE — Telephone Encounter (Signed)
Patient called answering service with LLQ pain which started yesterday. Pain reminiscent of diverticulitis ( previously documented on CTAP) He had a "low grade" fever last night. No urinary symptoms. BMs are at baseline.  Recommend:   Augmentin 875 mg BID x 7 days Clear liquids x 2 day.  Asked him to call Dr. Celesta Aver nurse at office early next week with an update.

## 2021-10-22 IMAGING — NM NM RENAL IMAGING FLOW W/ PHARM
2 series · 12 of 12 positions shown · non-contrast
Comparison: CT abdomen and pelvis 12/04/2020

CLINICAL DATA: RIGHT hydronephrosis and UPJ obstruction, pain and
swelling for 4 months

EXAM:
NUCLEAR MEDICINE RENAL SCAN WITH DIURETIC ADMINISTRATION
TECHNIQUE: Radionuclide angiographic and sequential renal images were obtained
after intravenous injection of radiopharmaceutical. Imaging was
continued during slow intravenous injection of Lasix approximately
15 minutes after the start of the examination.
RADIOPHARMACEUTICALS:  4.6 mCi 1echnetium-ZZm MAG3 IV
Pharmaceutical: Lasix 59 mg IV

[Series 1: renal scan · 4.14mm/px · 6 of 55 frames shown (1 of 2)]
[frame 5/55]
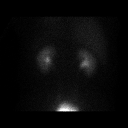
[frame 14/55]
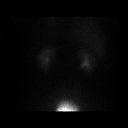
[frame 23/55]
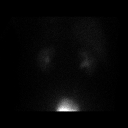
[frame 32/55]
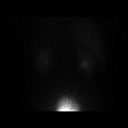
[frame 41/55]
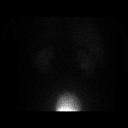
[frame 51/55]
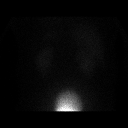

[Series 1: renal scan · 4.14mm/px · 6 of 40 frames shown (2 of 2)]
[frame 4/40  full-range]
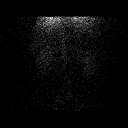
[frame 10/40  full-range]
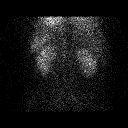
[frame 17/40  full-range]
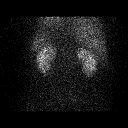
[frame 24/40  full-range]
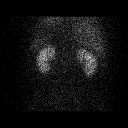
[frame 30/40  full-range]
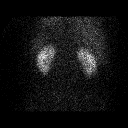
[frame 37/40  full-range]
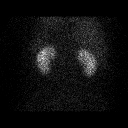

[12 of 12 positions shown; findings below may reference images not displayed]

FINDINGS: Flow:  Prompt symmetric arterial flow to the kidneys.

Left renogram: Normal uptake, concentration and excretion of tracer
by LEFT kidney. Good clearance before and continuing following Lasix
administration. No retained tracer at conclusion of exam. Analysis
of the renogram curve demonstrates a normal time to peak activity of
3.7 minutes with fall to half maximum activity 4.2 minutes later.

Right renogram: Normal uptake, concentration and excretion of tracer
by RIGHT kidney. Good clearance of tracer before and continuing
following Lasix administration. No collecting system dilatation
identified. No abnormal retained tracer at conclusion of exam.
Analysis of the renogram curve demonstrates a normal time to peak
activity of 3.7 minutes with fall to half maximum activity
minutes later.

Differential:

Left kidney = 51 %

Right kidney = 49 %

T1/2 post Lasix :

Left kidney = 7.3 min

Right kidney = 8.7 min
IMPRESSION: Normal exam.

## 2022-02-24 ENCOUNTER — Telehealth: Payer: No Typology Code available for payment source | Admitting: Physician Assistant

## 2022-02-24 DIAGNOSIS — J020 Streptococcal pharyngitis: Secondary | ICD-10-CM

## 2022-02-24 MED ORDER — AMOXICILLIN 400 MG/5ML PO SUSR: 800.0000 mg | Freq: Two times a day (BID) | ORAL | 0 refills | Status: DC

## 2022-02-24 NOTE — Progress Notes (Signed)

## 2023-08-17 ENCOUNTER — Telehealth: Payer: Self-pay | Admitting: Internal Medicine

## 2023-08-17 NOTE — Telephone Encounter (Signed)
Inbound call from patient's wife stating patient has been having pain due to diverticulitis. Requesting a call to discuss medication. Please advise, thank you.

## 2023-08-17 NOTE — Telephone Encounter (Signed)
He does have a hx but has not been seen since 2022 telehealth and I think he needs in person evaluation  Andrew Reyes has an opening tomorrow and would set him up with her if that works  Other option would be to see primary care as otherwise no way to see him this week, I think

## 2023-08-17 NOTE — Telephone Encounter (Signed)
PT wife returning call. Available appointment with [facility] has been filled. I advised to contact PCP regarding relief options. Next availability is 12/6 and she feels it is too  far. Please advise

## 2023-08-17 NOTE — Telephone Encounter (Signed)
Pt wife Corrie Dandy stated that the pt has been having left side abdominal pain for several weeks now that is gradually getting worse. Having normal BM's, No fever or nausea. Pt does have history of diverticulitis and wife states that she feels that this is what's going on.  Please review and advise.

## 2023-08-18 ENCOUNTER — Ambulatory Visit: Payer: No Typology Code available for payment source | Admitting: Gastroenterology

## 2023-08-18 NOTE — Telephone Encounter (Signed)
-----   Message ----- From: Jearl Klinefelter Sent: 08/18/2023  10:19 AM EST To: Richardson Chiquito, RN  Can put patient in 8am tomorrow with me. If he cannot make it, advise ED precautions or wait for PCP appt 12/6 as below.

## 2023-08-18 NOTE — Telephone Encounter (Signed)
Spoke to patient. He will come for visit with Boone Master, PA-C tomorrow at 8 am.

## 2023-08-18 NOTE — Telephone Encounter (Signed)
Left message for pt to call back  °

## 2023-08-18 NOTE — Telephone Encounter (Signed)
Left message for patient to call back  

## 2023-08-19 ENCOUNTER — Ambulatory Visit: Payer: No Typology Code available for payment source | Admitting: Gastroenterology

## 2024-09-27 ENCOUNTER — Telehealth: Admitting: Nurse Practitioner

## 2024-09-27 DIAGNOSIS — J0140 Acute pansinusitis, unspecified: Secondary | ICD-10-CM

## 2024-09-27 MED ORDER — AMOXICILLIN-POT CLAVULANATE 875-125 MG PO TABS: 1.0000 | ORAL_TABLET | Freq: Two times a day (BID) | ORAL | 0 refills | Status: AC

## 2024-09-27 MED ORDER — IPRATROPIUM BROMIDE 0.03 % NA SOLN: 2.0000 | Freq: Two times a day (BID) | NASAL | 12 refills | Status: AC

## 2024-09-27 NOTE — Progress Notes (Signed)
"   E-Visit for Sinus Problems   We are sorry that you are not feeling well.  Here is how we plan to help!   Based on what you have shared with me it looks like you have sinusitis.  Sinusitis is inflammation and infection in the sinus cavities of the head.  Based on your presentation I believe you most likely have Acute Bacterial Sinusitis.  This is an infection caused by bacteria and is treated with antibiotics. I have prescribed Augmentin 875mg /125mg  one tablet twice daily with food, for 7 days. and I have also prescribed Ipratropium Bromide Nasal Spray Use 1 spray in each nostril twice daily as needed for drainage; discontinue if too drying You may use an oral decongestant such as Mucinex D or if you have glaucoma or high blood pressure use plain Mucinex. Saline nasal spray help and can safely be used as often as needed for congestion.  If you develop worsening sinus pain, fever or notice severe headache and vision changes, or if symptoms are not better after completion of antibiotic, please schedule an appointment with a health care provider.     Sinus infections are not as easily transmitted as other respiratory infection, however we still recommend that you avoid close contact with loved ones, especially the very young and elderly.  Remember to wash your hands thoroughly throughout the day as this is the number one way to prevent the spread of infection!   Home Care: Only take medications as instructed by your medical team. Complete the entire course of an antibiotic. Do not take these medications with alcohol. A steam or ultrasonic humidifier can help congestion.  You can place a towel over your head and breathe in the steam from hot water coming from a faucet. Avoid close contacts especially the very young and the elderly. Cover your mouth when you cough or sneeze. Always remember to wash your hands.   Get Help Right Away If: You develop worsening fever or sinus pain. You develop a severe  head ache or visual changes. Your symptoms persist after you have completed your treatment plan.   Make sure you Understand these instructions. Will watch your condition. Will get help right away if you are not doing well or get worse.   Your e-visit answers were reviewed by a board certified advanced clinical practitioner to complete your personal care plan.  Depending on the condition, your plan could have included both over the counter or prescription medications.   If there is a problem please reply  once you have received a response from your provider.   Your safety is important to us .  If you have drug allergies check your prescription carefully.     You can use MyChart to ask questions about todays visit, request a non-urgent call back, or ask for a work or school excuse for 24 hours related to this e-Visit. If it has been greater than 24 hours you will need to follow up with your provider, or enter a new e-Visit to address those concerns.   You will get an e-mail in the next two days asking about your experience.  I hope that your e-visit has been valuable and will speed your recovery. Thank you for using e-visits.   I have spent 5 minutes in review of e-visit questionnaire, review and updating patient chart, medical decision making and response to patient.    Lauraine Kitty, FNP   "
